# Patient Record
Sex: Male | Born: 1960
Health system: Southern US, Community
[De-identification: ages and names within clinical notes are randomized; demographics above are authoritative.]

## PROBLEM LIST (undated history)

## (undated) DIAGNOSIS — I10 Essential (primary) hypertension: Secondary | ICD-10-CM

## (undated) DIAGNOSIS — I2699 Other pulmonary embolism without acute cor pulmonale: Secondary | ICD-10-CM

## (undated) DIAGNOSIS — K219 Gastro-esophageal reflux disease without esophagitis: Secondary | ICD-10-CM

## (undated) HISTORY — PX: OTHER SURGICAL HISTORY: SHX169

## (undated) HISTORY — DX: Other pulmonary embolism without acute cor pulmonale: I26.99

## (undated) HISTORY — DX: Essential (primary) hypertension: I10

## (undated) HISTORY — PX: ROTATOR CUFF REPAIR: SHX139

## (undated) HISTORY — DX: Gastro-esophageal reflux disease without esophagitis: K21.9

---

## 1999-02-18 ENCOUNTER — Emergency Department (HOSPITAL_COMMUNITY): Admission: EM | Admit: 1999-02-18 | Discharge: 1999-02-18 | Payer: Self-pay | Admitting: Emergency Medicine

## 1999-02-18 ENCOUNTER — Encounter: Payer: Self-pay | Admitting: Emergency Medicine

## 2007-04-11 ENCOUNTER — Emergency Department (HOSPITAL_COMMUNITY): Admission: EM | Admit: 2007-04-11 | Discharge: 2007-04-11 | Payer: Self-pay | Admitting: Emergency Medicine

## 2010-06-17 ENCOUNTER — Encounter: Payer: Self-pay | Admitting: Family Medicine

## 2010-12-02 ENCOUNTER — Emergency Department (HOSPITAL_COMMUNITY): Payer: BC Managed Care – PPO

## 2010-12-02 ENCOUNTER — Emergency Department (HOSPITAL_COMMUNITY)
Admission: EM | Admit: 2010-12-02 | Discharge: 2010-12-02 | Disposition: A | Discharge status: Home / Self Care | Payer: BC Managed Care – PPO | Attending: Emergency Medicine | Admitting: Emergency Medicine

## 2010-12-02 DIAGNOSIS — R1031 Right lower quadrant pain: Secondary | ICD-10-CM | POA: Insufficient documentation

## 2010-12-02 DIAGNOSIS — K7689 Other specified diseases of liver: Secondary | ICD-10-CM | POA: Insufficient documentation

## 2010-12-02 DIAGNOSIS — R10819 Abdominal tenderness, unspecified site: Secondary | ICD-10-CM | POA: Insufficient documentation

## 2010-12-02 LAB — DIFFERENTIAL
Basophils Absolute: 0 10*3/uL (ref 0.0–0.1)
Basophils Relative: 0 % (ref 0–1)
Eosinophils Absolute: 0 10*3/uL (ref 0.0–0.7)
Eosinophils Relative: 0 % (ref 0–5)
Lymphocytes Relative: 16 % (ref 12–46)
Lymphs Abs: 1.6 10*3/uL (ref 0.7–4.0)
Monocytes Absolute: 0.9 10*3/uL (ref 0.1–1.0)
Monocytes Relative: 9 % (ref 3–12)
Neutro Abs: 7.6 10*3/uL (ref 1.7–7.7)
Neutrophils Relative %: 74 % (ref 43–77)

## 2010-12-02 LAB — POCT I-STAT, CHEM 8
BUN: 17 mg/dL (ref 6–23)
Calcium, Ion: 1.03 mmol/L — ABNORMAL LOW (ref 1.12–1.32)
Chloride: 111 mEq/L (ref 96–112)
Creatinine, Ser: 1.1 mg/dL (ref 0.50–1.35)
Glucose, Bld: 120 mg/dL — ABNORMAL HIGH (ref 70–99)
TCO2: 19 mmol/L (ref 0–100)

## 2010-12-02 LAB — CBC
Hemoglobin: 16.6 g/dL (ref 13.0–17.0)
MCH: 34.4 pg — ABNORMAL HIGH (ref 26.0–34.0)
MCHC: 36.8 g/dL — ABNORMAL HIGH (ref 30.0–36.0)
MCV: 93.4 fL (ref 78.0–100.0)
Platelets: 199 10*3/uL (ref 150–400)
RBC: 4.83 MIL/uL (ref 4.22–5.81)

## 2010-12-02 LAB — URINALYSIS, ROUTINE W REFLEX MICROSCOPIC
Bilirubin Urine: NEGATIVE
Glucose, UA: NEGATIVE mg/dL
Ketones, ur: 40 mg/dL — AB
Protein, ur: NEGATIVE mg/dL
pH: 7.5 (ref 5.0–8.0)

## 2010-12-02 MED ORDER — IOHEXOL 300 MG/ML  SOLN
125.0000 mL | Freq: Once | INTRAMUSCULAR | Status: AC | PRN
  Administered 2010-12-02: 125 mL via INTRAVENOUS

## 2010-12-03 ENCOUNTER — Other Ambulatory Visit (HOSPITAL_COMMUNITY): Payer: Self-pay | Admitting: Family Medicine

## 2010-12-03 ENCOUNTER — Ambulatory Visit (HOSPITAL_COMMUNITY)
Admission: RE | Admit: 2010-12-03 | Discharge: 2010-12-03 | Disposition: A | Discharge status: Home / Self Care | Payer: BC Managed Care – PPO | Source: Ambulatory Visit | Attending: Family Medicine | Admitting: Family Medicine

## 2010-12-03 DIAGNOSIS — N200 Calculus of kidney: Secondary | ICD-10-CM

## 2010-12-03 DIAGNOSIS — R1031 Right lower quadrant pain: Secondary | ICD-10-CM | POA: Insufficient documentation

## 2010-12-03 DIAGNOSIS — R110 Nausea: Secondary | ICD-10-CM | POA: Insufficient documentation

## 2010-12-05 ENCOUNTER — Emergency Department (HOSPITAL_COMMUNITY): Payer: BC Managed Care – PPO

## 2010-12-05 ENCOUNTER — Emergency Department (HOSPITAL_COMMUNITY)
Admission: EM | Admit: 2010-12-05 | Discharge: 2010-12-05 | Disposition: A | Discharge status: Home / Self Care | Payer: BC Managed Care – PPO | Attending: Emergency Medicine | Admitting: Emergency Medicine

## 2010-12-05 DIAGNOSIS — R109 Unspecified abdominal pain: Secondary | ICD-10-CM | POA: Insufficient documentation

## 2010-12-05 DIAGNOSIS — R112 Nausea with vomiting, unspecified: Secondary | ICD-10-CM | POA: Insufficient documentation

## 2010-12-05 LAB — CBC
MCH: 33.3 pg (ref 26.0–34.0)
MCHC: 35 g/dL (ref 30.0–36.0)
MCV: 95.3 fL (ref 78.0–100.0)
Platelets: 194 10*3/uL (ref 150–400)
RBC: 4.5 MIL/uL (ref 4.22–5.81)
RDW: 12 % (ref 11.5–15.5)

## 2010-12-05 LAB — DIFFERENTIAL
Eosinophils Absolute: 0 10*3/uL (ref 0.0–0.7)
Eosinophils Relative: 0 % (ref 0–5)
Lymphs Abs: 1.9 10*3/uL (ref 0.7–4.0)
Monocytes Absolute: 0.8 10*3/uL (ref 0.1–1.0)
Monocytes Relative: 8 % (ref 3–12)

## 2010-12-05 LAB — COMPREHENSIVE METABOLIC PANEL
Albumin: 3.9 g/dL (ref 3.5–5.2)
Alkaline Phosphatase: 51 U/L (ref 39–117)
BUN: 10 mg/dL (ref 6–23)
Creatinine, Ser: 0.99 mg/dL (ref 0.50–1.35)
Potassium: 3.4 mEq/L — ABNORMAL LOW (ref 3.5–5.1)
Total Protein: 7.6 g/dL (ref 6.0–8.3)

## 2010-12-05 LAB — URINALYSIS, ROUTINE W REFLEX MICROSCOPIC
Glucose, UA: NEGATIVE mg/dL
Hgb urine dipstick: NEGATIVE
Ketones, ur: NEGATIVE mg/dL
Leukocytes, UA: NEGATIVE
pH: 6 (ref 5.0–8.0)

## 2010-12-05 LAB — LACTIC ACID, PLASMA: Lactic Acid, Venous: 0.8 mmol/L (ref 0.5–2.2)

## 2010-12-05 LAB — URINE MICROSCOPIC-ADD ON

## 2010-12-05 LAB — LIPASE, BLOOD: Lipase: 17 U/L (ref 11–59)

## 2010-12-06 LAB — URINE CULTURE: Culture  Setup Time: 201207111319

## 2011-04-22 ENCOUNTER — Emergency Department (HOSPITAL_BASED_OUTPATIENT_CLINIC_OR_DEPARTMENT_OTHER)
Admission: EM | Admit: 2011-04-22 | Discharge: 2011-04-22 | Disposition: A | Discharge status: Home / Self Care | Payer: BC Managed Care – PPO | Attending: Emergency Medicine | Admitting: Emergency Medicine

## 2011-04-22 ENCOUNTER — Emergency Department (INDEPENDENT_AMBULATORY_CARE_PROVIDER_SITE_OTHER): Payer: BC Managed Care – PPO

## 2011-04-22 ENCOUNTER — Encounter: Payer: Self-pay | Admitting: *Deleted

## 2011-04-22 DIAGNOSIS — R109 Unspecified abdominal pain: Secondary | ICD-10-CM | POA: Insufficient documentation

## 2011-04-22 DIAGNOSIS — R1110 Vomiting, unspecified: Secondary | ICD-10-CM

## 2011-04-22 DIAGNOSIS — K5900 Constipation, unspecified: Secondary | ICD-10-CM

## 2011-04-22 LAB — DIFFERENTIAL
Basophils Absolute: 0 10*3/uL (ref 0.0–0.1)
Basophils Relative: 0 % (ref 0–1)
Eosinophils Relative: 0 % (ref 0–5)
Monocytes Absolute: 1.2 10*3/uL — ABNORMAL HIGH (ref 0.1–1.0)

## 2011-04-22 LAB — LIPASE, BLOOD: Lipase: 44 U/L (ref 11–59)

## 2011-04-22 LAB — URINALYSIS, ROUTINE W REFLEX MICROSCOPIC
Glucose, UA: NEGATIVE mg/dL
Leukocytes, UA: NEGATIVE
Nitrite: NEGATIVE
Protein, ur: NEGATIVE mg/dL

## 2011-04-22 LAB — COMPREHENSIVE METABOLIC PANEL
AST: 26 U/L (ref 0–37)
Albumin: 4.3 g/dL (ref 3.5–5.2)
CO2: 26 mEq/L (ref 19–32)
Calcium: 9.4 mg/dL (ref 8.4–10.5)
Creatinine, Ser: 1 mg/dL (ref 0.50–1.35)
GFR calc non Af Amer: 85 mL/min — ABNORMAL LOW (ref >90)

## 2011-04-22 LAB — CBC
HCT: 44.7 % (ref 39.0–52.0)
MCH: 33.1 pg (ref 26.0–34.0)
MCHC: 35.8 g/dL (ref 30.0–36.0)
MCV: 92.5 fL (ref 78.0–100.0)
RDW: 11.6 % (ref 11.5–15.5)

## 2011-04-22 MED ORDER — ONDANSETRON 8 MG PO TBDP: 8.0000 mg | ORAL_TABLET | Freq: Three times a day (TID) | ORAL | Status: AC | PRN

## 2011-04-22 MED ORDER — ONDANSETRON HCL 4 MG/2ML IJ SOLN
4.0000 mg | Freq: Once | INTRAMUSCULAR | Status: AC
  Administered 2011-04-22: 4 mg via INTRAVENOUS
  Filled 2011-04-22: qty 2

## 2011-04-22 MED ORDER — ZOLPIDEM TARTRATE 5 MG PO TABS: 5.0000 mg | ORAL_TABLET | Freq: Every evening | ORAL | Status: DC | PRN

## 2011-04-22 MED ORDER — SODIUM CHLORIDE 0.9 % IV BOLUS (SEPSIS)
1000.0000 mL | Freq: Once | INTRAVENOUS | Status: AC
  Administered 2011-04-22: 1000 mL via INTRAVENOUS

## 2011-04-22 MED ORDER — IOHEXOL 300 MG/ML  SOLN
100.0000 mL | Freq: Once | INTRAMUSCULAR | Status: AC | PRN
  Administered 2011-04-22: 100 mL via INTRAVENOUS

## 2011-04-22 NOTE — ED Notes (Signed)
Constipation. Had an xray earlier today that showed constipation.

## 2011-04-22 NOTE — ED Notes (Signed)
Pt. Drove himself here.

## 2011-04-22 NOTE — ED Provider Notes (Signed)
History     CSN: [id number] Arrival date & time: 04/22/2011  7:45 PM   First MD Initiated Contact with Patient 04/22/11 1946      Chief Complaint  Patient presents with  . Abdominal Pain    (Consider location/radiation/quality/duration/timing/severity/associated sxs/prior treatment) HPI  Patient with abdominal pain for a week.  Pain is epigasrtic in nature and is throbbing.  Off and on sharp.  Began with vomiting on Tuesday 2 times then a few times on Wednesday. Low grade temp of 99.5 and felt like he was sick with stomach virus.  Was not vomiting on Thursday and only taking po fluids.  Felt somewhat better on Saturday and Sunday- took some smoothies on Saturday, then tried slider and tater tots on Saturday and vomited with pain.  Pain mostly resolved.  Sunday felt normal and raked yard and had smoothie and chicken and rice soup.  Patient seen at [facility] and had plain films and told he was constipated and to come to ed.    History reviewed. No pertinent past medical history.  Past Surgical History  Procedure Date  . Rotator cuff surgery     No family history on file.  History  Substance Use Topics  . Smoking status: Not on file  . Smokeless tobacco: Not on file  . Alcohol Use:       Review of Systems  All other systems reviewed and are negative.    Allergies  Review of patient's allergies indicates no known allergies.  Home Medications  No current outpatient prescriptions on file.  BP 131/89  Pulse 107  Temp(Src) 98.6 F (37 C) (Oral)  Resp 20  SpO2 98%  Physical Exam  Nursing note and vitals reviewed. Constitutional: He is oriented to person, place, and time. He appears well-developed and well-nourished.  HENT:  Head: Normocephalic and atraumatic.  Eyes: Conjunctivae and EOM are normal. Pupils are equal, round, and reactive to light.  Neck: Normal range of motion. Neck supple.  Abdominal: Soft. Bowel sounds are normal. There is tenderness.         Tender bilateral lower quadrants, right greater than left.  Musculoskeletal: Normal range of motion.  Neurological: He is alert and oriented to person, place, and time. He has normal reflexes.  Skin: Skin is warm and dry.  Psychiatric: He has a normal mood and affect.    ED Course  Procedures (including critical care time)  Labs Reviewed - No data to display No results found.   No diagnosis found.    MDM  Patient with normal labs and CT with any acute findings. He is thirsty here and is drinking ginger ale. He is advised to followup with his primary care Dr. He has received IV fluids and his heart rate has decreased. He has likely had a gastroenteritis and seems to be improving without any acute intra-abdominal abnormality.        Hilario Quarry, MD 04/22/11 2218

## 2012-01-29 ENCOUNTER — Encounter (HOSPITAL_COMMUNITY): Payer: Self-pay | Admitting: *Deleted

## 2012-01-29 ENCOUNTER — Emergency Department (HOSPITAL_COMMUNITY)
Admission: EM | Admit: 2012-01-29 | Discharge: 2012-01-29 | Disposition: A | Discharge status: Home / Self Care | Payer: Worker's Compensation | Attending: Emergency Medicine | Admitting: Emergency Medicine

## 2012-01-29 DIAGNOSIS — X038XXA Other exposure to controlled fire, not in building or structure, initial encounter: Secondary | ICD-10-CM | POA: Insufficient documentation

## 2012-01-29 DIAGNOSIS — T2020XA Burn of second degree of head, face, and neck, unspecified site, initial encounter: Secondary | ICD-10-CM | POA: Insufficient documentation

## 2012-01-29 DIAGNOSIS — T2000XA Burn of unspecified degree of head, face, and neck, unspecified site, initial encounter: Secondary | ICD-10-CM

## 2012-01-29 MED ORDER — HYDROCODONE-ACETAMINOPHEN 5-325 MG PO TABS: 1.0000 | ORAL_TABLET | ORAL | Status: AC | PRN

## 2012-01-29 MED ORDER — IBUPROFEN 600 MG PO TABS: 600.0000 mg | ORAL_TABLET | Freq: Three times a day (TID) | ORAL | Status: AC | PRN

## 2012-01-29 MED ORDER — BACITRACIN ZINC 500 UNIT/GM EX OINT
TOPICAL_OINTMENT | CUTANEOUS | Status: AC
  Filled 2012-01-29: qty 3.6
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
## 2015-08-28 DIAGNOSIS — L723 Sebaceous cyst: Secondary | ICD-10-CM | POA: Diagnosis not present

## 2015-10-05 DIAGNOSIS — M6740 Ganglion, unspecified site: Secondary | ICD-10-CM | POA: Diagnosis not present

## 2015-10-05 DIAGNOSIS — L821 Other seborrheic keratosis: Secondary | ICD-10-CM | POA: Diagnosis not present

## 2015-10-05 DIAGNOSIS — L720 Epidermal cyst: Secondary | ICD-10-CM | POA: Diagnosis not present

## 2016-01-02 DIAGNOSIS — Z0000 Encounter for general adult medical examination without abnormal findings: Secondary | ICD-10-CM | POA: Diagnosis not present

## 2016-01-02 DIAGNOSIS — Z13220 Encounter for screening for lipoid disorders: Secondary | ICD-10-CM | POA: Diagnosis not present

## 2016-01-02 DIAGNOSIS — Z23 Encounter for immunization: Secondary | ICD-10-CM | POA: Diagnosis not present

## 2016-03-18 DIAGNOSIS — I10 Essential (primary) hypertension: Secondary | ICD-10-CM | POA: Diagnosis not present

## 2016-06-03 DIAGNOSIS — H524 Presbyopia: Secondary | ICD-10-CM | POA: Diagnosis not present

## 2017-01-02 DIAGNOSIS — I10 Essential (primary) hypertension: Secondary | ICD-10-CM | POA: Diagnosis not present

## 2017-01-02 DIAGNOSIS — Z125 Encounter for screening for malignant neoplasm of prostate: Secondary | ICD-10-CM | POA: Diagnosis not present

## 2017-01-02 DIAGNOSIS — Z0000 Encounter for general adult medical examination without abnormal findings: Secondary | ICD-10-CM | POA: Diagnosis not present

## 2017-03-04 DIAGNOSIS — Z23 Encounter for immunization: Secondary | ICD-10-CM | POA: Diagnosis not present

## 2017-03-04 DIAGNOSIS — I10 Essential (primary) hypertension: Secondary | ICD-10-CM | POA: Diagnosis not present

## 2017-04-04 DIAGNOSIS — M720 Palmar fascial fibromatosis [Dupuytren]: Secondary | ICD-10-CM | POA: Insufficient documentation

## 2017-04-04 DIAGNOSIS — M79642 Pain in left hand: Secondary | ICD-10-CM | POA: Diagnosis not present

## 2017-05-08 DIAGNOSIS — A084 Viral intestinal infection, unspecified: Secondary | ICD-10-CM | POA: Diagnosis not present

## 2017-05-12 DIAGNOSIS — K529 Noninfective gastroenteritis and colitis, unspecified: Secondary | ICD-10-CM | POA: Diagnosis not present

## 2017-08-20 DIAGNOSIS — M545 Low back pain: Secondary | ICD-10-CM | POA: Diagnosis not present

## 2017-10-22 DIAGNOSIS — I10 Essential (primary) hypertension: Secondary | ICD-10-CM | POA: Diagnosis not present

## 2018-01-19 ENCOUNTER — Other Ambulatory Visit: Payer: Self-pay | Admitting: Family Medicine

## 2018-01-19 ENCOUNTER — Ambulatory Visit
Admission: RE | Admit: 2018-01-19 | Discharge: 2018-01-19 | Disposition: A | Discharge status: Home / Self Care | Payer: BLUE CROSS/BLUE SHIELD | Source: Ambulatory Visit | Attending: Family Medicine | Admitting: Family Medicine

## 2018-01-19 DIAGNOSIS — J181 Lobar pneumonia, unspecified organism: Principal | ICD-10-CM

## 2018-01-19 DIAGNOSIS — J189 Pneumonia, unspecified organism: Secondary | ICD-10-CM

## 2018-01-19 DIAGNOSIS — R05 Cough: Secondary | ICD-10-CM | POA: Diagnosis not present

## 2018-01-19 DIAGNOSIS — R6883 Chills (without fever): Secondary | ICD-10-CM | POA: Diagnosis not present

## 2018-03-10 DIAGNOSIS — M7582 Other shoulder lesions, left shoulder: Secondary | ICD-10-CM | POA: Diagnosis not present

## 2018-04-07 DIAGNOSIS — M7582 Other shoulder lesions, left shoulder: Secondary | ICD-10-CM | POA: Diagnosis not present

## 2018-04-13 DIAGNOSIS — Z0000 Encounter for general adult medical examination without abnormal findings: Secondary | ICD-10-CM | POA: Diagnosis not present

## 2018-04-13 DIAGNOSIS — I10 Essential (primary) hypertension: Secondary | ICD-10-CM | POA: Diagnosis not present

## 2018-04-13 DIAGNOSIS — Z23 Encounter for immunization: Secondary | ICD-10-CM | POA: Diagnosis not present

## 2018-05-08 DIAGNOSIS — M7582 Other shoulder lesions, left shoulder: Secondary | ICD-10-CM | POA: Diagnosis not present

## 2018-05-13 DIAGNOSIS — I10 Essential (primary) hypertension: Secondary | ICD-10-CM | POA: Diagnosis not present

## 2018-05-15 DIAGNOSIS — Z23 Encounter for immunization: Secondary | ICD-10-CM | POA: Diagnosis not present

## 2018-06-02 DIAGNOSIS — R790 Abnormal level of blood mineral: Secondary | ICD-10-CM | POA: Diagnosis not present

## 2018-07-04 DIAGNOSIS — M25512 Pain in left shoulder: Secondary | ICD-10-CM | POA: Diagnosis not present

## 2018-07-06 DIAGNOSIS — M7582 Other shoulder lesions, left shoulder: Secondary | ICD-10-CM | POA: Diagnosis not present

## 2018-07-10 DIAGNOSIS — M25612 Stiffness of left shoulder, not elsewhere classified: Secondary | ICD-10-CM | POA: Diagnosis not present

## 2018-07-10 DIAGNOSIS — M7542 Impingement syndrome of left shoulder: Secondary | ICD-10-CM | POA: Diagnosis not present

## 2018-07-10 DIAGNOSIS — S46012D Strain of muscle(s) and tendon(s) of the rotator cuff of left shoulder, subsequent encounter: Secondary | ICD-10-CM | POA: Diagnosis not present

## 2018-07-10 DIAGNOSIS — M6281 Muscle weakness (generalized): Secondary | ICD-10-CM | POA: Diagnosis not present

## 2018-07-14 DIAGNOSIS — M6281 Muscle weakness (generalized): Secondary | ICD-10-CM | POA: Diagnosis not present

## 2018-07-14 DIAGNOSIS — M7542 Impingement syndrome of left shoulder: Secondary | ICD-10-CM | POA: Diagnosis not present

## 2018-07-14 DIAGNOSIS — M25512 Pain in left shoulder: Secondary | ICD-10-CM | POA: Diagnosis not present

## 2018-07-14 DIAGNOSIS — M25612 Stiffness of left shoulder, not elsewhere classified: Secondary | ICD-10-CM | POA: Diagnosis not present

## 2018-07-17 DIAGNOSIS — Z23 Encounter for immunization: Secondary | ICD-10-CM | POA: Diagnosis not present

## 2018-07-21 DIAGNOSIS — S46112D Strain of muscle, fascia and tendon of long head of biceps, left arm, subsequent encounter: Secondary | ICD-10-CM | POA: Diagnosis not present

## 2018-07-21 DIAGNOSIS — M7542 Impingement syndrome of left shoulder: Secondary | ICD-10-CM | POA: Diagnosis not present

## 2018-07-21 DIAGNOSIS — M25612 Stiffness of left shoulder, not elsewhere classified: Secondary | ICD-10-CM | POA: Diagnosis not present

## 2018-07-21 DIAGNOSIS — S46012D Strain of muscle(s) and tendon(s) of the rotator cuff of left shoulder, subsequent encounter: Secondary | ICD-10-CM | POA: Diagnosis not present

## 2018-07-24 DIAGNOSIS — S46112D Strain of muscle, fascia and tendon of long head of biceps, left arm, subsequent encounter: Secondary | ICD-10-CM | POA: Diagnosis not present

## 2018-07-24 DIAGNOSIS — M25612 Stiffness of left shoulder, not elsewhere classified: Secondary | ICD-10-CM | POA: Diagnosis not present

## 2018-07-24 DIAGNOSIS — S46012D Strain of muscle(s) and tendon(s) of the rotator cuff of left shoulder, subsequent encounter: Secondary | ICD-10-CM | POA: Diagnosis not present

## 2018-07-24 DIAGNOSIS — M7542 Impingement syndrome of left shoulder: Secondary | ICD-10-CM | POA: Diagnosis not present

## 2018-07-30 DIAGNOSIS — M7542 Impingement syndrome of left shoulder: Secondary | ICD-10-CM | POA: Diagnosis not present

## 2018-07-30 DIAGNOSIS — M25612 Stiffness of left shoulder, not elsewhere classified: Secondary | ICD-10-CM | POA: Diagnosis not present

## 2018-07-30 DIAGNOSIS — S46112D Strain of muscle, fascia and tendon of long head of biceps, left arm, subsequent encounter: Secondary | ICD-10-CM | POA: Diagnosis not present

## 2018-07-30 DIAGNOSIS — S46012D Strain of muscle(s) and tendon(s) of the rotator cuff of left shoulder, subsequent encounter: Secondary | ICD-10-CM | POA: Diagnosis not present

## 2018-08-11 DIAGNOSIS — M6281 Muscle weakness (generalized): Secondary | ICD-10-CM | POA: Diagnosis not present

## 2018-08-11 DIAGNOSIS — M25612 Stiffness of left shoulder, not elsewhere classified: Secondary | ICD-10-CM | POA: Diagnosis not present

## 2018-08-11 DIAGNOSIS — M7542 Impingement syndrome of left shoulder: Secondary | ICD-10-CM | POA: Diagnosis not present

## 2018-08-11 DIAGNOSIS — M25512 Pain in left shoulder: Secondary | ICD-10-CM | POA: Diagnosis not present

## 2018-08-14 DIAGNOSIS — M25612 Stiffness of left shoulder, not elsewhere classified: Secondary | ICD-10-CM | POA: Diagnosis not present

## 2018-08-14 DIAGNOSIS — M7542 Impingement syndrome of left shoulder: Secondary | ICD-10-CM | POA: Diagnosis not present

## 2018-08-14 DIAGNOSIS — S46012D Strain of muscle(s) and tendon(s) of the rotator cuff of left shoulder, subsequent encounter: Secondary | ICD-10-CM | POA: Diagnosis not present

## 2018-08-14 DIAGNOSIS — M6281 Muscle weakness (generalized): Secondary | ICD-10-CM | POA: Diagnosis not present

## 2018-08-24 DIAGNOSIS — M25612 Stiffness of left shoulder, not elsewhere classified: Secondary | ICD-10-CM | POA: Diagnosis not present

## 2018-09-17 DIAGNOSIS — R52 Pain, unspecified: Secondary | ICD-10-CM | POA: Diagnosis not present

## 2018-09-22 DIAGNOSIS — S46012D Strain of muscle(s) and tendon(s) of the rotator cuff of left shoulder, subsequent encounter: Secondary | ICD-10-CM | POA: Diagnosis not present

## 2018-10-12 DIAGNOSIS — I10 Essential (primary) hypertension: Secondary | ICD-10-CM | POA: Diagnosis not present

## 2018-10-14 DIAGNOSIS — M24112 Other articular cartilage disorders, left shoulder: Secondary | ICD-10-CM | POA: Diagnosis not present

## 2018-10-14 DIAGNOSIS — M19012 Primary osteoarthritis, left shoulder: Secondary | ICD-10-CM | POA: Diagnosis not present

## 2018-10-14 DIAGNOSIS — G8918 Other acute postprocedural pain: Secondary | ICD-10-CM | POA: Diagnosis not present

## 2018-10-14 DIAGNOSIS — M7522 Bicipital tendinitis, left shoulder: Secondary | ICD-10-CM | POA: Diagnosis not present

## 2018-10-14 DIAGNOSIS — S46012A Strain of muscle(s) and tendon(s) of the rotator cuff of left shoulder, initial encounter: Secondary | ICD-10-CM | POA: Diagnosis not present

## 2018-10-14 DIAGNOSIS — S43432A Superior glenoid labrum lesion of left shoulder, initial encounter: Secondary | ICD-10-CM | POA: Diagnosis not present

## 2018-10-14 DIAGNOSIS — S46112A Strain of muscle, fascia and tendon of long head of biceps, left arm, initial encounter: Secondary | ICD-10-CM | POA: Diagnosis not present

## 2018-10-14 DIAGNOSIS — M7542 Impingement syndrome of left shoulder: Secondary | ICD-10-CM | POA: Diagnosis not present

## 2018-10-21 DIAGNOSIS — S46112D Strain of muscle, fascia and tendon of long head of biceps, left arm, subsequent encounter: Secondary | ICD-10-CM | POA: Diagnosis not present

## 2018-10-21 DIAGNOSIS — S46012D Strain of muscle(s) and tendon(s) of the rotator cuff of left shoulder, subsequent encounter: Secondary | ICD-10-CM | POA: Diagnosis not present

## 2018-10-21 DIAGNOSIS — M6281 Muscle weakness (generalized): Secondary | ICD-10-CM | POA: Diagnosis not present

## 2018-10-21 DIAGNOSIS — M25612 Stiffness of left shoulder, not elsewhere classified: Secondary | ICD-10-CM | POA: Diagnosis not present

## 2018-10-22 ENCOUNTER — Emergency Department (HOSPITAL_COMMUNITY): Payer: BLUE CROSS/BLUE SHIELD

## 2018-10-22 ENCOUNTER — Emergency Department (HOSPITAL_COMMUNITY)
Admission: EM | Admit: 2018-10-22 | Discharge: 2018-10-22 | Disposition: A | Discharge status: Home / Self Care | Payer: BLUE CROSS/BLUE SHIELD | Attending: Emergency Medicine | Admitting: Emergency Medicine

## 2018-10-22 ENCOUNTER — Other Ambulatory Visit: Payer: Self-pay | Admitting: Orthopedic Surgery

## 2018-10-22 ENCOUNTER — Ambulatory Visit
Admission: RE | Admit: 2018-10-22 | Discharge: 2018-10-22 | Disposition: A | Discharge status: Home / Self Care | Payer: BLUE CROSS/BLUE SHIELD | Source: Ambulatory Visit | Attending: Orthopedic Surgery | Admitting: Orthopedic Surgery

## 2018-10-22 ENCOUNTER — Encounter (HOSPITAL_COMMUNITY): Payer: Self-pay | Admitting: Emergency Medicine

## 2018-10-22 ENCOUNTER — Other Ambulatory Visit: Payer: Self-pay

## 2018-10-22 DIAGNOSIS — J90 Pleural effusion, not elsewhere classified: Secondary | ICD-10-CM | POA: Diagnosis not present

## 2018-10-22 DIAGNOSIS — R000 Tachycardia, unspecified: Secondary | ICD-10-CM | POA: Diagnosis not present

## 2018-10-22 DIAGNOSIS — R079 Chest pain, unspecified: Secondary | ICD-10-CM

## 2018-10-22 DIAGNOSIS — R0602 Shortness of breath: Secondary | ICD-10-CM | POA: Diagnosis not present

## 2018-10-22 DIAGNOSIS — Z20828 Contact with and (suspected) exposure to other viral communicable diseases: Secondary | ICD-10-CM | POA: Diagnosis not present

## 2018-10-22 DIAGNOSIS — Z79899 Other long term (current) drug therapy: Secondary | ICD-10-CM | POA: Insufficient documentation

## 2018-10-22 DIAGNOSIS — I2510 Atherosclerotic heart disease of native coronary artery without angina pectoris: Secondary | ICD-10-CM | POA: Diagnosis not present

## 2018-10-22 DIAGNOSIS — R350 Frequency of micturition: Secondary | ICD-10-CM | POA: Diagnosis not present

## 2018-10-22 DIAGNOSIS — I2699 Other pulmonary embolism without acute cor pulmonale: Secondary | ICD-10-CM | POA: Diagnosis not present

## 2018-10-22 DIAGNOSIS — R109 Unspecified abdominal pain: Secondary | ICD-10-CM

## 2018-10-22 DIAGNOSIS — N390 Urinary tract infection, site not specified: Secondary | ICD-10-CM | POA: Diagnosis not present

## 2018-10-22 LAB — CBC WITH DIFFERENTIAL/PLATELET
Abs Immature Granulocytes: 0.02 10*3/uL (ref 0.00–0.07)
Basophils Absolute: 0.1 10*3/uL (ref 0.0–0.1)
Basophils Relative: 1 %
Eosinophils Absolute: 0 10*3/uL (ref 0.0–0.5)
Eosinophils Relative: 0 %
HCT: 35.1 % — ABNORMAL LOW (ref 39.0–52.0)
Hemoglobin: 11.8 g/dL — ABNORMAL LOW (ref 13.0–17.0)
Immature Granulocytes: 0 %
Lymphocytes Relative: 13 %
Lymphs Abs: 1 10*3/uL (ref 0.7–4.0)
MCH: 33.5 pg (ref 26.0–34.0)
MCHC: 33.6 g/dL (ref 30.0–36.0)
MCV: 99.7 fL (ref 80.0–100.0)
Monocytes Absolute: 1.2 10*3/uL — ABNORMAL HIGH (ref 0.1–1.0)
Monocytes Relative: 15 %
Neutro Abs: 5.5 10*3/uL (ref 1.7–7.7)
Neutrophils Relative %: 71 %
Platelets: 264 10*3/uL (ref 150–400)
RBC: 3.52 MIL/uL — ABNORMAL LOW (ref 4.22–5.81)
RDW: 12.1 % (ref 11.5–15.5)
WBC: 7.8 10*3/uL (ref 4.0–10.5)
nRBC: 0 % (ref 0.0–0.2)

## 2018-10-22 LAB — BASIC METABOLIC PANEL
Anion gap: 9 (ref 5–15)
BUN: 29 mg/dL — ABNORMAL HIGH (ref 6–20)
CO2: 27 mmol/L (ref 22–32)
Calcium: 9.8 mg/dL (ref 8.9–10.3)
Chloride: 99 mmol/L (ref 98–111)
Creatinine, Ser: 1.12 mg/dL (ref 0.61–1.24)
GFR calc Af Amer: >60 mL/min (ref >60)
GFR calc non Af Amer: >60 mL/min (ref >60)
Glucose, Bld: 133 mg/dL — ABNORMAL HIGH (ref 70–99)
Potassium: 3.9 mmol/L (ref 3.5–5.1)
Sodium: 135 mmol/L (ref 135–145)

## 2018-10-22 LAB — SARS CORONAVIRUS 2 BY RT PCR (HOSPITAL ORDER, PERFORMED IN ~~LOC~~ HOSPITAL LAB): SARS Coronavirus 2: NEGATIVE

## 2018-10-22 MED ORDER — RIVAROXABAN (XARELTO) EDUCATION KIT FOR DVT/PE PATIENTS
PACK | Freq: Once | Status: AC
  Administered 2018-10-22: 18:00:00
  Filled 2018-10-22: qty 1

## 2018-10-22 MED ORDER — FENTANYL CITRATE (PF) 100 MCG/2ML IJ SOLN
100.0000 ug | Freq: Once | INTRAMUSCULAR | Status: AC
  Administered 2018-10-22: 100 ug via INTRAVENOUS
  Filled 2018-10-22: qty 2

## 2018-10-22 MED ORDER — RIVAROXABAN 15 MG PO TABS
15.0000 mg | ORAL_TABLET | Freq: Once | ORAL | Status: AC
  Administered 2018-10-22: 18:00:00 15 mg via ORAL
  Filled 2018-10-22: qty 1

## 2018-10-22 MED ORDER — ONDANSETRON HCL 4 MG/2ML IJ SOLN
4.0000 mg | Freq: Once | INTRAMUSCULAR | Status: AC
  Administered 2018-10-22: 4 mg via INTRAVENOUS
  Filled 2018-10-22: qty 2

## 2018-10-22 MED ORDER — RIVAROXABAN (XARELTO) VTE STARTER PACK (15 & 20 MG): ORAL_TABLET | ORAL | 0 refills | Status: DC

## 2018-10-22 MED ORDER — ALBUTEROL SULFATE (2.5 MG/3ML) 0.083% IN NEBU
5.0000 mg | INHALATION_SOLUTION | Freq: Once | RESPIRATORY_TRACT | Status: DC
  Filled 2018-10-22: qty 6

## 2018-10-22 MED ORDER — SODIUM CHLORIDE (PF) 0.9 % IJ SOLN
INTRAMUSCULAR | Status: AC
  Filled 2018-10-22: qty 50

## 2018-10-22 MED ORDER — HYDROCODONE-ACETAMINOPHEN 5-325 MG PO TABS: 2.0000 | ORAL_TABLET | ORAL | 0 refills | Status: DC | PRN

## 2018-10-22 MED ORDER — IOHEXOL 350 MG/ML SOLN
100.0000 mL | Freq: Once | INTRAVENOUS | Status: AC | PRN
  Administered 2018-10-22: 17:00:00 100 mL via INTRAVENOUS

## 2018-10-22 NOTE — Discharge Instructions (Signed)
Take the medications as directed.  Continue your current treatment for your shoulder rehab.  Call your PCP for a follow-up appointment in 1 or 2 weeks.  ____________________________________________________________  Information on my medicine - XARELTO (rivaroxaban)  This medication education was reviewed with me or my healthcare representative as part of my discharge preparation.    WHY WAS XARELTO PRESCRIBED FOR YOU? Xarelto was prescribed to treat blood clots that may have been found in the veins of your legs (deep vein thrombosis) or in your lungs (pulmonary embolism) and to reduce the risk of them occurring again.  What do you need to know about Xarelto? The starting dose is one 15 mg tablet taken TWICE daily with food for the FIRST 21 DAYS then on 11/13/18  the dose is changed to one 20 mg tablet taken ONCE A DAY with your evening meal.  DO NOT stop taking Xarelto without talking to the health care provider who prescribed the medication.  Refill your prescription for 20 mg tablets before you run out.  After discharge, you should have regular check-up appointments with your healthcare provider that is prescribing your Xarelto.  In the future your dose may need to be changed if your kidney function changes by a significant amount.  What do you do if you miss a dose? If you are taking Xarelto TWICE DAILY and you miss a dose, take it as soon as you remember. You may take two 15 mg tablets (total 30 mg) at the same time then resume your regularly scheduled 15 mg twice daily the next day.  If you are taking Xarelto ONCE DAILY and you miss a dose, take it as soon as you remember on the same day then continue your regularly scheduled once daily regimen the next day. Do not take two doses of Xarelto at the same time.   Important Safety Information Xarelto is a blood thinner medicine that can cause bleeding. You should call your healthcare provider right away if you experience any of the  following: ? Bleeding from an injury or your nose that does not stop. ? Unusual colored urine (red or dark brown) or unusual colored stools (red or black). ? Unusual bruising for unknown reasons. ? A serious fall or if you hit your head (even if there is no bleeding).  Some medicines may interact with Xarelto and might increase your risk of bleeding while on Xarelto. To help avoid this, consult your healthcare provider or pharmacist prior to using any new prescription or non-prescription medications, including herbals, vitamins, non-steroidal anti-inflammatory drugs (NSAIDs) and supplements.  This website has more information on Xarelto: VisitDestination.com.br.

## 2018-10-22 NOTE — ED Provider Notes (Addendum)
[place] DEPT Provider Note   CSN: [id number] Arrival date & time: 10/22/18  1352    History   Chief Complaint Chief Complaint  Patient presents with   Shortness of Breath    HPI Nathan Brewer is a 58 y.o. male.     HPI   He is here for evaluation of right-sided chest pain, started yesterday, worse with deep breathing and occasionally with movement.  He had shoulder surgery done, 8 days ago, and is in a shoulder immobilizer.  He began rehabilitation for the left shoulder, yesterday.  Sometimes he feels like he has to cough but is not able to bring up any fluid.  He denies fever, chills, nausea, vomiting, weakness or dizziness.  No known exposure to Covid-19.  No other known sick contacts.  No prior history of venous thromboembolism.  There are no other known modifying factors.  History reviewed. No pertinent past medical history.  There are no active problems to display for this patient.   Past Surgical History:  Procedure Laterality Date   ROTATOR CUFF REPAIR     L shoulder   rotator cuff surgery          Home Medications    Prior to Admission medications   Medication Sig Start Date End Date Taking? Authorizing Provider  cetirizine (ZYRTEC) 10 MG tablet Take 10 mg by mouth daily.    [provider]  cyclobenzaprine (FLEXERIL) 5 MG tablet Take 5 mg by mouth daily. 10/14/18   [provider]  HYDROcodone-acetaminophen (NORCO) 5-325 MG tablet Take 2 tablets by mouth every 4 (four) hours as needed. 10/22/18   Daleen Bo, MD  HYDROmorphone (DILAUDID) 2 MG tablet Take 2 mg by mouth 2 (two) times a day. 10/16/18   [provider]  lisinopril-hydrochlorothiazide (ZESTORETIC) 20-12.5 MG tablet Take 1 tablet by mouth daily. 10/10/18   [provider]  meloxicam (MOBIC) 15 MG tablet Take 15 mg by mouth daily. 09/14/18   [provider]  naproxen (NAPROSYN) 250 MG tablet Take 250 mg by mouth 2  (two) times daily with a meal. pain    [provider]  oxyCODONE (OXY IR/ROXICODONE) 5 MG immediate release tablet Take 5 mg by mouth every 4 (four) hours as needed. 10/14/18   [provider]  Rivaroxaban 15 & 20 MG TBPK Take as directed on package: Start with one 57m tablet by mouth BID with food. On Day 22, switch to one 265mtablet QD with food. 10/22/18   WeDaleen BoMD    Family History No family history on file.  Social History Social History   Tobacco Use   Smoking status: Not on file  Substance Use Topics   Alcohol use: Yes    Comment: occa   Drug use: No     Allergies   Patient has no known allergies.   Review of Systems Review of Systems  All other systems reviewed and are negative.    Physical Exam Updated Vital Signs BP 117/75    Pulse (!) 101    Temp 98.4 F (36.9 C) (Oral)    Resp (!) 22    SpO2 94%   Physical Exam Vitals signs and nursing note reviewed.  Constitutional:      Appearance: He is well-developed.  HENT:     Head: Normocephalic and atraumatic.     Right Ear: External ear normal.     Left Ear: External ear normal.  Eyes:     Conjunctiva/sclera:  Conjunctivae normal.     Pupils: Pupils are equal, round, and reactive to light.  Neck:     Musculoskeletal: Normal range of motion and neck supple.     Trachea: Phonation normal.  Cardiovascular:     Rate and Rhythm: Normal rate and regular rhythm.     Heart sounds: Normal heart sounds.  Pulmonary:     Effort: Pulmonary effort is normal. No respiratory distress.     Breath sounds: Normal breath sounds. No stridor. No rhonchi.  Abdominal:     Palpations: Abdomen is soft.     Tenderness: There is no abdominal tenderness.  Musculoskeletal:     Comments: Left arm and shoulder immobilizer.  Normal range of motion left hand and fingers.  Normal perfusion and sensation fingers.  Legs have normal appearance there is no swelling, deformity or tenderness of either leg.   Skin:    General: Skin is warm and dry.  Neurological:     Mental Status: He is alert and oriented to person, place, and time.     Cranial Nerves: No cranial nerve deficit.     Sensory: No sensory deficit.     Motor: No abnormal muscle tone.     Coordination: Coordination normal.  Psychiatric:        Behavior: Behavior normal.        Thought Content: Thought content normal.        Judgment: Judgment normal.      ED Treatments / Results  Labs (all labs ordered are listed, but only abnormal results are displayed) Labs Reviewed  CBC WITH DIFFERENTIAL/PLATELET - Abnormal; Notable for the following components:      Result Value   RBC 3.52 (*)    Hemoglobin 11.8 (*)    HCT 35.1 (*)    Monocytes Absolute 1.2 (*)    All other components within normal limits  BASIC METABOLIC PANEL - Abnormal; Notable for the following components:   Glucose, Bld 133 (*)    BUN 29 (*)    All other components within normal limits  SARS CORONAVIRUS 2 (HOSPITAL ORDER, [facility])    EKG EKG Interpretation  Date/Time:  Thursday Oct 22 2018 14:07:00 EDT Ventricular Rate:  105 PR Interval:    QRS Duration: 91 QT Interval:  322 QTC Calculation: 426 R Axis:   47 Text Interpretation:  Sinus tachycardia Since last tracing rate faster Confirmed by Daleen Bo [phone]) on 10/22/2018 5:12:17 PM   Radiology Dg Chest 2 View  Result Date: 10/22/2018 CLINICAL DATA:  Right-sided chest and flank pain for the past day with shortness of breath. Recent rotator cuff surgery a week ago. EXAM: CHEST - 2 VIEW COMPARISON:  Chest x-ray dated January 19, 2018. FINDINGS: The heart size and mediastinal contours are within normal limits. Normal pulmonary vascularity. Low lung volumes with bibasilar atelectasis. New small right pleural effusion. No pneumothorax. No acute osseous abnormality. IMPRESSION: 1. New small right pleural effusion. 2. Low lung volumes with bibasilar atelectasis.  Electronically Signed   By: Titus Dubin M.D.   On: 10/22/2018 12:14   Ct Angio Chest Pe W And/or Wo Contrast  Result Date: 10/22/2018 CLINICAL DATA:  PE suspected, high probability. Recent shoulder surgery. Chest pain. EXAM: CT ANGIOGRAPHY CHEST WITH CONTRAST TECHNIQUE: Multidetector CT imaging of the chest was performed using the standard protocol during bolus administration of intravenous contrast. Multiplanar CT image reconstructions and MIPs were obtained to evaluate the vascular anatomy. CONTRAST:  143m OMNIPAQUE IOHEXOL 350  MG/ML SOLN COMPARISON:  None. FINDINGS: Cardiovascular: Evaluation of lower lobe branches is difficult due to respiratory motion. There appears to be filling defects within the posterior right lower lobe pulmonary arteries concerning for pulmonary emboli. Heart is normal size. Aorta is normal caliber. Coronary artery calcifications in the left anterior descending coronary artery. Mediastinum/Nodes: No mediastinal, hilar, or axillary adenopathy. Lungs/Pleura: Linear atelectasis in the lower lobes bilaterally. Small right pleural effusion. Upper Abdomen: Imaging into the upper abdomen shows no acute findings. Musculoskeletal: Chest wall soft tissues are unremarkable. No acute bony abnormality. Review of the MIP images confirms the above findings. IMPRESSION: Evaluation of the lower lobe pulmonary arteries is somewhat limited by respiratory motion. There appear to be filling defects within the posterior right lower lobe pulmonary arterial branches concerning for pulmonary emboli. Linear bibasilar opacities, likely atelectasis. Small right pleural effusion. Coronary artery disease. Electronically Signed   By: Rolm Baptise M.D.   On: 10/22/2018 16:56    Procedures Procedures (including critical care time)  Medications Ordered in ED Medications  sodium chloride (PF) 0.9 % injection (has no administration in time range)  Rivaroxaban (XARELTO) tablet 15 mg (has no administration  in time range)  rivaroxaban (XARELTO) Education Kit for DVT/PE patients (has no administration in time range)  ondansetron (ZOFRAN) injection 4 mg (4 mg Intravenous Given 10/22/18 1619)  fentaNYL (SUBLIMAZE) injection 100 mcg (100 mcg Intravenous Given 10/22/18 1619)  iohexol (OMNIPAQUE) 350 MG/ML injection 100 mL (100 mLs Intravenous Contrast Given 10/22/18 1636)     Initial Impression / Assessment and Plan / ED Course  I have reviewed the triage vital signs and the nursing notes.  Pertinent labs & imaging results that were available during my care of the patient were reviewed by me and considered in my medical decision making (see chart for details).  Clinical Course as of Oct 22 1723  Thu Oct 22, 2018  1656 Normal except glucose high, BUN high  Basic metabolic panel(!) [EW]  8338 Normal except hemoglobin low  CBC with Differential(!) [EW]  1702 Right lower lobe pulmonary emboli, with indistinct.  No large vessel disease.  Images reviewed by me  CT Angio Chest PE W and/or Wo Contrast [EW]    Clinical Course User Index [EW] Daleen Bo, MD        Patient Vitals for the past 24 hrs:  BP Temp Temp src Pulse Resp SpO2  10/22/18 1600 117/75 -- -- (!) 101 (!) 22 94 %  10/22/18 1530 118/77 -- -- (!) 104 20 96 %  10/22/18 1441 117/69 -- -- (!) 103 (!) 23 96 %  10/22/18 1430 -- -- -- (!) 102 (!) 23 96 %  10/22/18 1415 -- -- -- 96 (!) 22 96 %  10/22/18 1408 123/63 98.4 F (36.9 C) Oral (!) 104 20 96 %    3:04 PM Reevaluation with update and discussion. After initial assessment and treatment, an updated evaluation reveals he is comfortable, vital signs are normal.  Findings discussed with the patient and all questions were answered. Daleen Bo   Medical Decision Making: Pulmonary Embolism related to recent surgery.  Patient is a very low risk.  No indication for hospitalization or further intervention at this time.  Patient is in agreement with this plan  I sent a note to the  patient's orthopedist regarding the diagnosis today.  CRITICAL CARE-yes Performed by: Daleen Bo  Nursing Notes Reviewed/ Care Coordinated Applicable Imaging Reviewed Interpretation of Laboratory Data incorporated into ED treatment  The patient  appears reasonably screened and/or stabilized for discharge and I doubt any other medical condition or other [facility] requiring further screening, evaluation, or treatment in the ED at this time prior to discharge.  Plan: Home Medications-continue usual medications; Home Treatments-rest, fluids; return here if the recommended treatment, does not improve the symptoms; Recommended follow up-PCP, 1 to 2 weeks for follow-up.  Contact orthopedics, to inform them of this event.   Final Clinical Impressions(s) / ED Diagnoses   Final diagnoses:  Acute pulmonary embolism without acute cor pulmonale, unspecified pulmonary embolism type [facility])    ED Discharge Orders         Ordered    Rivaroxaban 15 & 20 MG TBPK     10/22/18 1718    HYDROcodone-acetaminophen (NORCO) 5-325 MG tablet  Every 4 hours PRN     10/22/18 1725           Daleen Bo, MD 10/22/18 1725    Daleen Bo, MD 10/22/18 2010

## 2018-10-22 NOTE — ED Triage Notes (Signed)
Per pt, states he recently had left shoulder surgery-having right CP-states increased pain with burping-PCP thinks he might have fluid on his lung-had CXR done at [facility] here for CT scan

## 2018-10-23 ENCOUNTER — Ambulatory Visit (HOSPITAL_COMMUNITY)
Admission: RE | Admit: 2018-10-23 | Discharge: 2018-10-23 | Disposition: A | Discharge status: Home / Self Care | Payer: BLUE CROSS/BLUE SHIELD | Source: Ambulatory Visit | Attending: Internal Medicine | Admitting: Internal Medicine

## 2018-10-23 ENCOUNTER — Encounter (HOSPITAL_COMMUNITY): Payer: Self-pay

## 2018-10-23 ENCOUNTER — Other Ambulatory Visit (HOSPITAL_COMMUNITY): Payer: Self-pay | Admitting: Orthopedic Surgery

## 2018-10-23 DIAGNOSIS — M7989 Other specified soft tissue disorders: Secondary | ICD-10-CM | POA: Diagnosis not present

## 2018-10-23 DIAGNOSIS — M79604 Pain in right leg: Secondary | ICD-10-CM | POA: Diagnosis not present

## 2018-10-23 NOTE — Progress Notes (Unsigned)
The right lower venous has been completed and is positive for DVT.  The left lower venous has been completed and is negative for DVT.  Preliminary results can be found under CV proc through chart review.  Elliot Gault, RVT [facility]

## 2018-10-26 DIAGNOSIS — M25612 Stiffness of left shoulder, not elsewhere classified: Secondary | ICD-10-CM | POA: Diagnosis not present

## 2018-10-26 DIAGNOSIS — M6281 Muscle weakness (generalized): Secondary | ICD-10-CM | POA: Diagnosis not present

## 2018-10-26 DIAGNOSIS — S46012D Strain of muscle(s) and tendon(s) of the rotator cuff of left shoulder, subsequent encounter: Secondary | ICD-10-CM | POA: Diagnosis not present

## 2018-10-26 DIAGNOSIS — S46112D Strain of muscle, fascia and tendon of long head of biceps, left arm, subsequent encounter: Secondary | ICD-10-CM | POA: Diagnosis not present

## 2018-10-28 DIAGNOSIS — S46112D Strain of muscle, fascia and tendon of long head of biceps, left arm, subsequent encounter: Secondary | ICD-10-CM | POA: Diagnosis not present

## 2018-10-28 DIAGNOSIS — S46012D Strain of muscle(s) and tendon(s) of the rotator cuff of left shoulder, subsequent encounter: Secondary | ICD-10-CM | POA: Diagnosis not present

## 2018-10-28 DIAGNOSIS — M6281 Muscle weakness (generalized): Secondary | ICD-10-CM | POA: Diagnosis not present

## 2018-10-28 DIAGNOSIS — M25612 Stiffness of left shoulder, not elsewhere classified: Secondary | ICD-10-CM | POA: Diagnosis not present

## 2018-10-29 DIAGNOSIS — Z86711 Personal history of pulmonary embolism: Secondary | ICD-10-CM | POA: Diagnosis not present

## 2018-11-02 DIAGNOSIS — S46012D Strain of muscle(s) and tendon(s) of the rotator cuff of left shoulder, subsequent encounter: Secondary | ICD-10-CM | POA: Diagnosis not present

## 2018-11-02 DIAGNOSIS — S46112D Strain of muscle, fascia and tendon of long head of biceps, left arm, subsequent encounter: Secondary | ICD-10-CM | POA: Diagnosis not present

## 2018-11-02 DIAGNOSIS — M6281 Muscle weakness (generalized): Secondary | ICD-10-CM | POA: Diagnosis not present

## 2018-11-02 DIAGNOSIS — M25612 Stiffness of left shoulder, not elsewhere classified: Secondary | ICD-10-CM | POA: Diagnosis not present

## 2018-11-04 DIAGNOSIS — M6281 Muscle weakness (generalized): Secondary | ICD-10-CM | POA: Diagnosis not present

## 2018-11-04 DIAGNOSIS — M25612 Stiffness of left shoulder, not elsewhere classified: Secondary | ICD-10-CM | POA: Diagnosis not present

## 2018-11-04 DIAGNOSIS — S46012D Strain of muscle(s) and tendon(s) of the rotator cuff of left shoulder, subsequent encounter: Secondary | ICD-10-CM | POA: Diagnosis not present

## 2018-11-09 DIAGNOSIS — M25612 Stiffness of left shoulder, not elsewhere classified: Secondary | ICD-10-CM | POA: Diagnosis not present

## 2018-11-09 DIAGNOSIS — S46012D Strain of muscle(s) and tendon(s) of the rotator cuff of left shoulder, subsequent encounter: Secondary | ICD-10-CM | POA: Diagnosis not present

## 2018-11-09 DIAGNOSIS — M6281 Muscle weakness (generalized): Secondary | ICD-10-CM | POA: Diagnosis not present

## 2018-11-09 DIAGNOSIS — S46112D Strain of muscle, fascia and tendon of long head of biceps, left arm, subsequent encounter: Secondary | ICD-10-CM | POA: Diagnosis not present

## 2018-11-11 DIAGNOSIS — S46112D Strain of muscle, fascia and tendon of long head of biceps, left arm, subsequent encounter: Secondary | ICD-10-CM | POA: Diagnosis not present

## 2018-11-11 DIAGNOSIS — S46012D Strain of muscle(s) and tendon(s) of the rotator cuff of left shoulder, subsequent encounter: Secondary | ICD-10-CM | POA: Diagnosis not present

## 2018-11-11 DIAGNOSIS — M25612 Stiffness of left shoulder, not elsewhere classified: Secondary | ICD-10-CM | POA: Diagnosis not present

## 2018-11-11 DIAGNOSIS — M6281 Muscle weakness (generalized): Secondary | ICD-10-CM | POA: Diagnosis not present

## 2018-11-12 DIAGNOSIS — M25612 Stiffness of left shoulder, not elsewhere classified: Secondary | ICD-10-CM | POA: Diagnosis not present

## 2018-11-16 DIAGNOSIS — S46112D Strain of muscle, fascia and tendon of long head of biceps, left arm, subsequent encounter: Secondary | ICD-10-CM | POA: Diagnosis not present

## 2018-11-16 DIAGNOSIS — M25612 Stiffness of left shoulder, not elsewhere classified: Secondary | ICD-10-CM | POA: Diagnosis not present

## 2018-11-16 DIAGNOSIS — M6281 Muscle weakness (generalized): Secondary | ICD-10-CM | POA: Diagnosis not present

## 2018-11-16 DIAGNOSIS — S46012D Strain of muscle(s) and tendon(s) of the rotator cuff of left shoulder, subsequent encounter: Secondary | ICD-10-CM | POA: Diagnosis not present

## 2018-11-18 DIAGNOSIS — S46012D Strain of muscle(s) and tendon(s) of the rotator cuff of left shoulder, subsequent encounter: Secondary | ICD-10-CM | POA: Diagnosis not present

## 2018-11-18 DIAGNOSIS — M25612 Stiffness of left shoulder, not elsewhere classified: Secondary | ICD-10-CM | POA: Diagnosis not present

## 2018-11-18 DIAGNOSIS — M6281 Muscle weakness (generalized): Secondary | ICD-10-CM | POA: Diagnosis not present

## 2018-11-23 DIAGNOSIS — M25612 Stiffness of left shoulder, not elsewhere classified: Secondary | ICD-10-CM | POA: Diagnosis not present

## 2018-11-23 DIAGNOSIS — M6281 Muscle weakness (generalized): Secondary | ICD-10-CM | POA: Diagnosis not present

## 2018-11-23 DIAGNOSIS — S46012D Strain of muscle(s) and tendon(s) of the rotator cuff of left shoulder, subsequent encounter: Secondary | ICD-10-CM | POA: Diagnosis not present

## 2018-11-23 DIAGNOSIS — S46112D Strain of muscle, fascia and tendon of long head of biceps, left arm, subsequent encounter: Secondary | ICD-10-CM | POA: Diagnosis not present

## 2018-11-25 DIAGNOSIS — S46112D Strain of muscle, fascia and tendon of long head of biceps, left arm, subsequent encounter: Secondary | ICD-10-CM | POA: Diagnosis not present

## 2018-11-25 DIAGNOSIS — M25612 Stiffness of left shoulder, not elsewhere classified: Secondary | ICD-10-CM | POA: Diagnosis not present

## 2018-11-25 DIAGNOSIS — S46012D Strain of muscle(s) and tendon(s) of the rotator cuff of left shoulder, subsequent encounter: Secondary | ICD-10-CM | POA: Diagnosis not present

## 2018-11-25 DIAGNOSIS — M6281 Muscle weakness (generalized): Secondary | ICD-10-CM | POA: Diagnosis not present

## 2018-11-30 DIAGNOSIS — M25612 Stiffness of left shoulder, not elsewhere classified: Secondary | ICD-10-CM | POA: Diagnosis not present

## 2018-11-30 DIAGNOSIS — S46012D Strain of muscle(s) and tendon(s) of the rotator cuff of left shoulder, subsequent encounter: Secondary | ICD-10-CM | POA: Diagnosis not present

## 2018-11-30 DIAGNOSIS — S46112D Strain of muscle, fascia and tendon of long head of biceps, left arm, subsequent encounter: Secondary | ICD-10-CM | POA: Diagnosis not present

## 2018-11-30 DIAGNOSIS — M6281 Muscle weakness (generalized): Secondary | ICD-10-CM | POA: Diagnosis not present

## 2018-12-02 DIAGNOSIS — S46012D Strain of muscle(s) and tendon(s) of the rotator cuff of left shoulder, subsequent encounter: Secondary | ICD-10-CM | POA: Diagnosis not present

## 2018-12-02 DIAGNOSIS — S46112D Strain of muscle, fascia and tendon of long head of biceps, left arm, subsequent encounter: Secondary | ICD-10-CM | POA: Diagnosis not present

## 2018-12-02 DIAGNOSIS — M6281 Muscle weakness (generalized): Secondary | ICD-10-CM | POA: Diagnosis not present

## 2018-12-02 DIAGNOSIS — M25612 Stiffness of left shoulder, not elsewhere classified: Secondary | ICD-10-CM | POA: Diagnosis not present

## 2018-12-07 DIAGNOSIS — M6281 Muscle weakness (generalized): Secondary | ICD-10-CM | POA: Diagnosis not present

## 2018-12-07 DIAGNOSIS — M25612 Stiffness of left shoulder, not elsewhere classified: Secondary | ICD-10-CM | POA: Diagnosis not present

## 2018-12-07 DIAGNOSIS — R42 Dizziness and giddiness: Secondary | ICD-10-CM | POA: Diagnosis not present

## 2018-12-07 DIAGNOSIS — S46112D Strain of muscle, fascia and tendon of long head of biceps, left arm, subsequent encounter: Secondary | ICD-10-CM | POA: Diagnosis not present

## 2018-12-07 DIAGNOSIS — R55 Syncope and collapse: Secondary | ICD-10-CM | POA: Diagnosis not present

## 2018-12-07 DIAGNOSIS — I10 Essential (primary) hypertension: Secondary | ICD-10-CM | POA: Diagnosis not present

## 2018-12-07 DIAGNOSIS — S46012D Strain of muscle(s) and tendon(s) of the rotator cuff of left shoulder, subsequent encounter: Secondary | ICD-10-CM | POA: Diagnosis not present

## 2018-12-07 DIAGNOSIS — I2699 Other pulmonary embolism without acute cor pulmonale: Secondary | ICD-10-CM | POA: Diagnosis not present

## 2018-12-07 NOTE — Progress Notes (Signed)
Cardiology Office Note   Date:  12/08/2018   ID:  Nathan Brewer, DOB 11/14/1960, MRN [id number]  PCP:  Elias Elseeade, Robert, MD  Cardiologist:   No primary care provider on file. Referring:  Elias Elseeade, Robert, MD  Chief Complaint  Patient presents with  . New Patient (Initial Visit)      History of Present Illness: Nathan Brewer is a 58 y.o. male who was referred by Elias Elseeade, Robert, MD for evaluation of syncope.  The patient has a history of recent pulmonary embolism and DVT.  He has no past cardiac history.  He had rotator cuff surgery in May.  He then presented with some chest discomfort and had a slight pleural effusion noted on chest x-ray.  He was found to have CT of the chest questionable pulmonary embolism.  He was subsequently found to have deep venous thrombosis and is currently on Xarelto.  He otherwise been doing well.  He says that he was sitting on the couch walking to get something from the refrigerator.  Interestingly he has a video camera in his house that is recording and so I can actually see the event.  He got up took several steps toward the kitchen, was in the kitchen door when he turned around and then had a frank syncopal spell.  Immediately upon hitting the floor you could hear him call out.  He did not have any significant trauma.  He really did not have any prodrome.  He did check his blood pressure immediately after sitting on the couch and his systolic was in the 70s.  He has never done this before.  He has some mild orthostatic symptoms rarely but this is not frequent.  He does not describe chest pressure, neck or arm discomfort.  He does not notice palpitations, presyncope or syncope.  He does not have any shortness of breath, PND orthopnea.  He has had no weight gain or edema.  He does report that his heart rate has been running a little high recently.  He shows me his blood pressures are typically in the 1 teens to 130s.   Past Medical History:  Diagnosis Date   . GERD (gastroesophageal reflux disease)   . HTN (hypertension)   . Pulmonary emboli [facility](HCC)     Past Surgical History:  Procedure Laterality Date  . ROTATOR CUFF REPAIR Left    x 2     Current Outpatient Medications  Medication Sig Dispense Refill  . cetirizine (ZYRTEC) 10 MG tablet Take 10 mg by mouth daily.    Marland Kitchen. lisinopril-hydrochlorothiazide (ZESTORETIC) 20-12.5 MG tablet Take 1 tablet by mouth daily.    Carlena Hurl. XARELTO 20 MG TABS tablet daily.      No current facility-administered medications for this visit.     Allergies:   Patient has no known allergies.    Social History:  The patient  reports that he has never smoked. He has never used smokeless tobacco. He reports current alcohol use. He reports that he does not use drugs.   Family History:  The patient's family history includes CAD in his mother.    ROS:  Please see the history of present illness.   Otherwise, review of systems are positive for none.   All other systems are reviewed and negative.    PHYSICAL EXAM: VS:  Pulse 90   Temp 97.9 F (36.6 C)   Ht 5\' 4"  (1.626 m)   Wt 194 lb 12.8 oz (88.4 kg)   BMI  33.44 kg/m  , BMI Body mass index is 33.44 kg/m. GENERAL:  Well appearing HEENT:  Pupils equal round and reactive, fundi not visualized, oral mucosa unremarkable NECK:  No jugular venous distention, waveform within normal limits, carotid upstroke brisk and symmetric, no bruits, no thyromegaly LYMPHATICS:  No cervical, inguinal adenopathy LUNGS:  Clear to auscultation bilaterally BACK:  No CVA tenderness CHEST:  Unremarkable HEART:  PMI not displaced or sustained,S1 and S2 within normal limits, no S3, no S4, no clicks, no rubs, no murmurs ABD:  Flat, positive bowel sounds normal in frequency in pitch, no bruits, no rebound, no guarding, no midline pulsatile mass, no hepatomegaly, no splenomegaly EXT:  2 plus pulses throughout, no edema, no cyanosis no clubbing SKIN:  No rashes no nodules NEURO:  Cranial  nerves II through XII grossly intact, motor grossly intact throughout PSYCH:  Cognitively intact, oriented to person place and time    EKG:  EKG is ordered today. The ekg ordered today demonstrates sinus tachycardia, rate 105, axis within normal limits, intervals within normal limits, no acute ST-T wave changes 12/08/2018   Recent Labs: 10/22/2018: BUN 29; Creatinine, Ser 1.12; Hemoglobin 11.8; Platelets 264; Potassium 3.9; Sodium 135    Lipid Panel No results found for: CHOL, TRIG, HDL, CHOLHDL, VLDL, LDLCALC, LDLDIRECT    Wt Readings from Last 3 Encounters:  12/08/18 194 lb 12.8 oz (88.4 kg)      Other studies Reviewed: Additional studies/ records that were reviewed today include: Labs, primary office records, ED May records. Review of the above records demonstrates:  Please see elsewhere in the note.     ASSESSMENT AND PLAN:  SYNCOPE: Patient had a syncopal episode that most likely represents a vasovagal event.  However, he did have a pulmonary embolism so I will make sure he has a structurally normal heart though I suspect this by exam.  I am going to check an echocardiogram.  We will also need an event monitor given his tachycardia that he reports and also some question from wearable but only takes heart rate the question of irregularity and atrial fibrillation.  HTN: His blood pressure is controlled although a little bit labile.  He has any lightheadedness in the future I would suggest reducing his lisinopril.   Current medicines are reviewed at length with the patient today.  The patient does not have concerns regarding medicines.  The following changes have been made:  no change  Labs/ tests ordered today include:   Orders Placed This Encounter  Procedures  . LONG TERM MONITOR (3-14 DAYS)  . ECHOCARDIOGRAM COMPLETE     Disposition:   FU with me as needed based on the results of the above.     Signed, Minus Breeding, MD  12/08/2018 4:51 PM    [facility]

## 2018-12-08 ENCOUNTER — Ambulatory Visit (INDEPENDENT_AMBULATORY_CARE_PROVIDER_SITE_OTHER): Payer: BC Managed Care – PPO | Admitting: Cardiology

## 2018-12-08 ENCOUNTER — Encounter: Payer: Self-pay | Admitting: Cardiology

## 2018-12-08 ENCOUNTER — Other Ambulatory Visit: Payer: Self-pay

## 2018-12-08 VITALS — HR 90 | Temp 97.9°F | Ht 64.0 in | Wt 194.8 lb

## 2018-12-08 DIAGNOSIS — H5203 Hypermetropia, bilateral: Secondary | ICD-10-CM | POA: Diagnosis not present

## 2018-12-08 DIAGNOSIS — I10 Essential (primary) hypertension: Secondary | ICD-10-CM

## 2018-12-08 DIAGNOSIS — H524 Presbyopia: Secondary | ICD-10-CM | POA: Diagnosis not present

## 2018-12-08 DIAGNOSIS — R55 Syncope and collapse: Secondary | ICD-10-CM

## 2018-12-08 DIAGNOSIS — H1045 Other chronic allergic conjunctivitis: Secondary | ICD-10-CM | POA: Diagnosis not present

## 2018-12-08 DIAGNOSIS — H2513 Age-related nuclear cataract, bilateral: Secondary | ICD-10-CM | POA: Diagnosis not present

## 2018-12-08 DIAGNOSIS — H52223 Regular astigmatism, bilateral: Secondary | ICD-10-CM | POA: Diagnosis not present

## 2018-12-08 DIAGNOSIS — H40033 Anatomical narrow angle, bilateral: Secondary | ICD-10-CM | POA: Diagnosis not present

## 2018-12-08 NOTE — Patient Instructions (Signed)
Medication Instructions:  Your physician recommends that you continue on your current medications as directed. Please refer to the Current Medication list given to you today.  If you need a refill on your cardiac medications before your next appointment, please call your pharmacy.   Lab work: NONE  Testing/Procedures: Your physician has requested that you have an echocardiogram. Echocardiography is a painless test that uses sound waves to create images of your heart. It provides your doctor with information about the size and shape of your heart and how well your heart's chambers and valves are working. This procedure takes approximately one hour. There are no restrictions for this procedure. [facility] AT [address]  2 WEEK ZIO PATCH   Follow-Up: AS NEEDED   Any Other Special Instructions Will Be Listed Below (If Applicable).   Echocardiogram An echocardiogram is a procedure that uses painless sound waves (ultrasound) to produce an image of the heart. Images from an echocardiogram can provide important information about:  Signs of coronary artery disease (CAD).  Aneurysm detection. An aneurysm is a weak or damaged part of an artery wall that bulges out from the normal force of blood pumping through the body.  Heart size and shape. Changes in the size or shape of the heart can be associated with certain conditions, including heart failure, aneurysm, and CAD.  Heart muscle function.  Heart valve function.  Signs of a past heart attack.  Fluid buildup around the heart.  Thickening of the heart muscle.  A tumor or infectious growth around the heart valves. Tell a health care provider about:  Any allergies you have.  All medicines you are taking, including vitamins, herbs, eye drops, creams, and over-the-counter medicines.  Any blood disorders you have.  Any surgeries you have had.  Any medical conditions you have.  Whether you are pregnant or may be  pregnant. What are the risks? Generally, this is a safe procedure. However, problems may occur, including:  Allergic reaction to dye (contrast) that may be used during the procedure. What happens before the procedure? No specific preparation is needed. You may eat and drink normally. What happens during the procedure?   An IV tube may be inserted into one of your veins.  You may receive contrast through this tube. A contrast is an injection that improves the quality of the pictures from your heart.  A gel will be applied to your chest.  A wand-like tool (transducer) will be moved over your chest. The gel will help to transmit the sound waves from the transducer.  The sound waves will harmlessly bounce off of your heart to allow the heart images to be captured in real-time motion. The images will be recorded on a computer. The procedure may vary among health care providers and hospitals. What happens after the procedure?  You may return to your normal, everyday life, including diet, activities, and medicines, unless your health care provider tells you not to do that. Summary  An echocardiogram is a procedure that uses painless sound waves (ultrasound) to produce an image of the heart.  Images from an echocardiogram can provide important information about the size and shape of your heart, heart muscle function, heart valve function, and fluid buildup around your heart.  You do not need to do anything to prepare before this procedure. You may eat and drink normally.  After the echocardiogram is completed, you may return to your normal, everyday life, unless your health care provider tells  you not to do that. This information is not intended to replace advice given to you by your health care provider. Make sure you discuss any questions you have with your health care provider. Document Released: 05/10/2000 Document Revised: 09/03/2018 Document Reviewed: 06/15/2016 Elsevier Patient  Education  2020 [employer].

## 2018-12-09 DIAGNOSIS — S46112D Strain of muscle, fascia and tendon of long head of biceps, left arm, subsequent encounter: Secondary | ICD-10-CM | POA: Diagnosis not present

## 2018-12-09 DIAGNOSIS — M6281 Muscle weakness (generalized): Secondary | ICD-10-CM | POA: Diagnosis not present

## 2018-12-09 DIAGNOSIS — M25612 Stiffness of left shoulder, not elsewhere classified: Secondary | ICD-10-CM | POA: Diagnosis not present

## 2018-12-09 DIAGNOSIS — S46012D Strain of muscle(s) and tendon(s) of the rotator cuff of left shoulder, subsequent encounter: Secondary | ICD-10-CM | POA: Diagnosis not present

## 2018-12-14 DIAGNOSIS — M6281 Muscle weakness (generalized): Secondary | ICD-10-CM | POA: Diagnosis not present

## 2018-12-14 DIAGNOSIS — M25612 Stiffness of left shoulder, not elsewhere classified: Secondary | ICD-10-CM | POA: Diagnosis not present

## 2018-12-14 DIAGNOSIS — S46112D Strain of muscle, fascia and tendon of long head of biceps, left arm, subsequent encounter: Secondary | ICD-10-CM | POA: Diagnosis not present

## 2018-12-14 DIAGNOSIS — S46012D Strain of muscle(s) and tendon(s) of the rotator cuff of left shoulder, subsequent encounter: Secondary | ICD-10-CM | POA: Diagnosis not present

## 2018-12-15 ENCOUNTER — Other Ambulatory Visit: Payer: Self-pay

## 2018-12-15 ENCOUNTER — Ambulatory Visit (HOSPITAL_COMMUNITY): Payer: BC Managed Care – PPO | Attending: Cardiology

## 2018-12-15 DIAGNOSIS — R55 Syncope and collapse: Secondary | ICD-10-CM

## 2018-12-16 DIAGNOSIS — M25612 Stiffness of left shoulder, not elsewhere classified: Secondary | ICD-10-CM | POA: Diagnosis not present

## 2018-12-16 DIAGNOSIS — S46012D Strain of muscle(s) and tendon(s) of the rotator cuff of left shoulder, subsequent encounter: Secondary | ICD-10-CM | POA: Diagnosis not present

## 2018-12-16 DIAGNOSIS — S46112D Strain of muscle, fascia and tendon of long head of biceps, left arm, subsequent encounter: Secondary | ICD-10-CM | POA: Diagnosis not present

## 2018-12-16 DIAGNOSIS — M6281 Muscle weakness (generalized): Secondary | ICD-10-CM | POA: Diagnosis not present

## 2018-12-24 ENCOUNTER — Telehealth: Payer: Self-pay | Admitting: Radiology

## 2018-12-24 NOTE — Telephone Encounter (Signed)
Enrolled patient for a 14 day Zio monitor to be mailed. Brief instructions were gone over and patient know to expect the monitor to arrive in 3-4 days.

## 2018-12-28 DIAGNOSIS — S46012D Strain of muscle(s) and tendon(s) of the rotator cuff of left shoulder, subsequent encounter: Secondary | ICD-10-CM | POA: Diagnosis not present

## 2018-12-28 DIAGNOSIS — M25612 Stiffness of left shoulder, not elsewhere classified: Secondary | ICD-10-CM | POA: Diagnosis not present

## 2018-12-28 DIAGNOSIS — S46112D Strain of muscle, fascia and tendon of long head of biceps, left arm, subsequent encounter: Secondary | ICD-10-CM | POA: Diagnosis not present

## 2018-12-28 DIAGNOSIS — M6281 Muscle weakness (generalized): Secondary | ICD-10-CM | POA: Diagnosis not present

## 2018-12-30 DIAGNOSIS — M6281 Muscle weakness (generalized): Secondary | ICD-10-CM | POA: Diagnosis not present

## 2018-12-30 DIAGNOSIS — M25612 Stiffness of left shoulder, not elsewhere classified: Secondary | ICD-10-CM | POA: Diagnosis not present

## 2018-12-30 DIAGNOSIS — S46112D Strain of muscle, fascia and tendon of long head of biceps, left arm, subsequent encounter: Secondary | ICD-10-CM | POA: Diagnosis not present

## 2018-12-30 DIAGNOSIS — S46012D Strain of muscle(s) and tendon(s) of the rotator cuff of left shoulder, subsequent encounter: Secondary | ICD-10-CM | POA: Diagnosis not present

## 2019-01-04 ENCOUNTER — Other Ambulatory Visit (INDEPENDENT_AMBULATORY_CARE_PROVIDER_SITE_OTHER): Payer: BC Managed Care – PPO

## 2019-01-04 DIAGNOSIS — S46012D Strain of muscle(s) and tendon(s) of the rotator cuff of left shoulder, subsequent encounter: Secondary | ICD-10-CM | POA: Diagnosis not present

## 2019-01-04 DIAGNOSIS — R55 Syncope and collapse: Secondary | ICD-10-CM | POA: Diagnosis not present

## 2019-01-04 DIAGNOSIS — S46112D Strain of muscle, fascia and tendon of long head of biceps, left arm, subsequent encounter: Secondary | ICD-10-CM | POA: Diagnosis not present

## 2019-01-04 DIAGNOSIS — M25612 Stiffness of left shoulder, not elsewhere classified: Secondary | ICD-10-CM | POA: Diagnosis not present

## 2019-01-04 DIAGNOSIS — M6281 Muscle weakness (generalized): Secondary | ICD-10-CM | POA: Diagnosis not present

## 2019-01-06 DIAGNOSIS — S46012D Strain of muscle(s) and tendon(s) of the rotator cuff of left shoulder, subsequent encounter: Secondary | ICD-10-CM | POA: Diagnosis not present

## 2019-01-06 DIAGNOSIS — M25612 Stiffness of left shoulder, not elsewhere classified: Secondary | ICD-10-CM | POA: Diagnosis not present

## 2019-01-06 DIAGNOSIS — S46112D Strain of muscle, fascia and tendon of long head of biceps, left arm, subsequent encounter: Secondary | ICD-10-CM | POA: Diagnosis not present

## 2019-01-06 DIAGNOSIS — M6281 Muscle weakness (generalized): Secondary | ICD-10-CM | POA: Diagnosis not present

## 2019-01-11 DIAGNOSIS — M6281 Muscle weakness (generalized): Secondary | ICD-10-CM | POA: Diagnosis not present

## 2019-01-11 DIAGNOSIS — S46112D Strain of muscle, fascia and tendon of long head of biceps, left arm, subsequent encounter: Secondary | ICD-10-CM | POA: Diagnosis not present

## 2019-01-11 DIAGNOSIS — S46012D Strain of muscle(s) and tendon(s) of the rotator cuff of left shoulder, subsequent encounter: Secondary | ICD-10-CM | POA: Diagnosis not present

## 2019-01-11 DIAGNOSIS — M25612 Stiffness of left shoulder, not elsewhere classified: Secondary | ICD-10-CM | POA: Diagnosis not present

## 2019-01-13 DIAGNOSIS — S46012D Strain of muscle(s) and tendon(s) of the rotator cuff of left shoulder, subsequent encounter: Secondary | ICD-10-CM | POA: Diagnosis not present

## 2019-01-13 DIAGNOSIS — S46112D Strain of muscle, fascia and tendon of long head of biceps, left arm, subsequent encounter: Secondary | ICD-10-CM | POA: Diagnosis not present

## 2019-01-13 DIAGNOSIS — M25612 Stiffness of left shoulder, not elsewhere classified: Secondary | ICD-10-CM | POA: Diagnosis not present

## 2019-01-13 DIAGNOSIS — M6281 Muscle weakness (generalized): Secondary | ICD-10-CM | POA: Diagnosis not present

## 2019-01-14 DIAGNOSIS — Z86711 Personal history of pulmonary embolism: Secondary | ICD-10-CM | POA: Diagnosis not present

## 2019-01-14 DIAGNOSIS — I10 Essential (primary) hypertension: Secondary | ICD-10-CM | POA: Diagnosis not present

## 2019-01-18 DIAGNOSIS — M25612 Stiffness of left shoulder, not elsewhere classified: Secondary | ICD-10-CM | POA: Diagnosis not present

## 2019-01-18 DIAGNOSIS — M6281 Muscle weakness (generalized): Secondary | ICD-10-CM | POA: Diagnosis not present

## 2019-01-18 DIAGNOSIS — S46112D Strain of muscle, fascia and tendon of long head of biceps, left arm, subsequent encounter: Secondary | ICD-10-CM | POA: Diagnosis not present

## 2019-01-18 DIAGNOSIS — S46012D Strain of muscle(s) and tendon(s) of the rotator cuff of left shoulder, subsequent encounter: Secondary | ICD-10-CM | POA: Diagnosis not present

## 2019-01-20 DIAGNOSIS — S46012D Strain of muscle(s) and tendon(s) of the rotator cuff of left shoulder, subsequent encounter: Secondary | ICD-10-CM | POA: Diagnosis not present

## 2019-01-20 DIAGNOSIS — M25612 Stiffness of left shoulder, not elsewhere classified: Secondary | ICD-10-CM | POA: Diagnosis not present

## 2019-01-20 DIAGNOSIS — S46112D Strain of muscle, fascia and tendon of long head of biceps, left arm, subsequent encounter: Secondary | ICD-10-CM | POA: Diagnosis not present

## 2019-01-20 DIAGNOSIS — M6281 Muscle weakness (generalized): Secondary | ICD-10-CM | POA: Diagnosis not present

## 2019-01-25 DIAGNOSIS — S46112D Strain of muscle, fascia and tendon of long head of biceps, left arm, subsequent encounter: Secondary | ICD-10-CM | POA: Diagnosis not present

## 2019-01-25 DIAGNOSIS — M6281 Muscle weakness (generalized): Secondary | ICD-10-CM | POA: Diagnosis not present

## 2019-01-25 DIAGNOSIS — S46012D Strain of muscle(s) and tendon(s) of the rotator cuff of left shoulder, subsequent encounter: Secondary | ICD-10-CM | POA: Diagnosis not present

## 2019-01-25 DIAGNOSIS — M25612 Stiffness of left shoulder, not elsewhere classified: Secondary | ICD-10-CM | POA: Diagnosis not present

## 2019-01-27 DIAGNOSIS — S46012D Strain of muscle(s) and tendon(s) of the rotator cuff of left shoulder, subsequent encounter: Secondary | ICD-10-CM | POA: Diagnosis not present

## 2019-01-27 DIAGNOSIS — M25612 Stiffness of left shoulder, not elsewhere classified: Secondary | ICD-10-CM | POA: Diagnosis not present

## 2019-01-27 DIAGNOSIS — M6281 Muscle weakness (generalized): Secondary | ICD-10-CM | POA: Diagnosis not present

## 2019-01-27 DIAGNOSIS — S46112D Strain of muscle, fascia and tendon of long head of biceps, left arm, subsequent encounter: Secondary | ICD-10-CM | POA: Diagnosis not present

## 2019-02-04 ENCOUNTER — Telehealth: Payer: Self-pay | Admitting: Cardiology

## 2019-02-04 NOTE — Telephone Encounter (Signed)
Result Notes for LONG TERM MONITOR (3-14 DAYS)  Notes recorded by Minus Breeding, MD on 01/22/2019 at 1:18 PM EDT  No significant arrhythmias. Brief runs of SVT. No further work up. Call Mr. Paar with the results and send results to Maury Dus, MD   Results reviewed with pt. No further questions. Results also faxed to pt PCP Maury Dus, MD via [facility] fax function

## 2019-02-04 NOTE — Telephone Encounter (Signed)
  Patient is calling regarding monitor results

## 2019-02-08 DIAGNOSIS — S46112D Strain of muscle, fascia and tendon of long head of biceps, left arm, subsequent encounter: Secondary | ICD-10-CM | POA: Diagnosis not present

## 2019-02-08 DIAGNOSIS — M25612 Stiffness of left shoulder, not elsewhere classified: Secondary | ICD-10-CM | POA: Diagnosis not present

## 2019-02-08 DIAGNOSIS — M6281 Muscle weakness (generalized): Secondary | ICD-10-CM | POA: Diagnosis not present

## 2019-02-08 DIAGNOSIS — S46012D Strain of muscle(s) and tendon(s) of the rotator cuff of left shoulder, subsequent encounter: Secondary | ICD-10-CM | POA: Diagnosis not present

## 2019-02-10 DIAGNOSIS — S46112D Strain of muscle, fascia and tendon of long head of biceps, left arm, subsequent encounter: Secondary | ICD-10-CM | POA: Diagnosis not present

## 2019-02-10 DIAGNOSIS — S46012D Strain of muscle(s) and tendon(s) of the rotator cuff of left shoulder, subsequent encounter: Secondary | ICD-10-CM | POA: Diagnosis not present

## 2019-02-10 DIAGNOSIS — M25612 Stiffness of left shoulder, not elsewhere classified: Secondary | ICD-10-CM | POA: Diagnosis not present

## 2019-02-10 DIAGNOSIS — M6281 Muscle weakness (generalized): Secondary | ICD-10-CM | POA: Diagnosis not present

## 2019-02-11 DIAGNOSIS — M25612 Stiffness of left shoulder, not elsewhere classified: Secondary | ICD-10-CM | POA: Diagnosis not present

## 2019-02-15 DIAGNOSIS — S46012D Strain of muscle(s) and tendon(s) of the rotator cuff of left shoulder, subsequent encounter: Secondary | ICD-10-CM | POA: Diagnosis not present

## 2019-02-15 DIAGNOSIS — M6281 Muscle weakness (generalized): Secondary | ICD-10-CM | POA: Diagnosis not present

## 2019-02-15 DIAGNOSIS — M25612 Stiffness of left shoulder, not elsewhere classified: Secondary | ICD-10-CM | POA: Diagnosis not present

## 2019-02-15 DIAGNOSIS — S46112D Strain of muscle, fascia and tendon of long head of biceps, left arm, subsequent encounter: Secondary | ICD-10-CM | POA: Diagnosis not present

## 2019-02-22 DIAGNOSIS — S46112D Strain of muscle, fascia and tendon of long head of biceps, left arm, subsequent encounter: Secondary | ICD-10-CM | POA: Diagnosis not present

## 2019-02-22 DIAGNOSIS — S46012D Strain of muscle(s) and tendon(s) of the rotator cuff of left shoulder, subsequent encounter: Secondary | ICD-10-CM | POA: Diagnosis not present

## 2019-02-22 DIAGNOSIS — M25612 Stiffness of left shoulder, not elsewhere classified: Secondary | ICD-10-CM | POA: Diagnosis not present

## 2019-02-22 DIAGNOSIS — M6281 Muscle weakness (generalized): Secondary | ICD-10-CM | POA: Diagnosis not present

## 2019-03-09 ENCOUNTER — Other Ambulatory Visit: Payer: Self-pay

## 2019-03-09 DIAGNOSIS — Z20828 Contact with and (suspected) exposure to other viral communicable diseases: Secondary | ICD-10-CM | POA: Diagnosis not present

## 2019-03-09 DIAGNOSIS — Z20822 Contact with and (suspected) exposure to covid-19: Secondary | ICD-10-CM

## 2019-03-10 LAB — NOVEL CORONAVIRUS, NAA: SARS-CoV-2, NAA: NOT DETECTED

## 2019-04-27 DIAGNOSIS — Z0000 Encounter for general adult medical examination without abnormal findings: Secondary | ICD-10-CM | POA: Diagnosis not present

## 2019-04-27 DIAGNOSIS — Z13220 Encounter for screening for lipoid disorders: Secondary | ICD-10-CM | POA: Diagnosis not present

## 2019-04-27 DIAGNOSIS — Z23 Encounter for immunization: Secondary | ICD-10-CM | POA: Diagnosis not present

## 2019-05-05 DIAGNOSIS — Z20828 Contact with and (suspected) exposure to other viral communicable diseases: Secondary | ICD-10-CM | POA: Diagnosis not present

## 2019-05-07 DIAGNOSIS — Z20828 Contact with and (suspected) exposure to other viral communicable diseases: Secondary | ICD-10-CM | POA: Diagnosis not present

## 2019-05-17 DIAGNOSIS — Z03818 Encounter for observation for suspected exposure to other biological agents ruled out: Secondary | ICD-10-CM | POA: Diagnosis not present

## 2019-12-11 IMAGING — CR DG CHEST 2V
2 series · 2 of 2 positions shown · non-contrast
Comparison: None.

CLINICAL DATA: Left lower lobe pneumonia.

EXAM:
CHEST - 2 VIEW

[w chest pa]
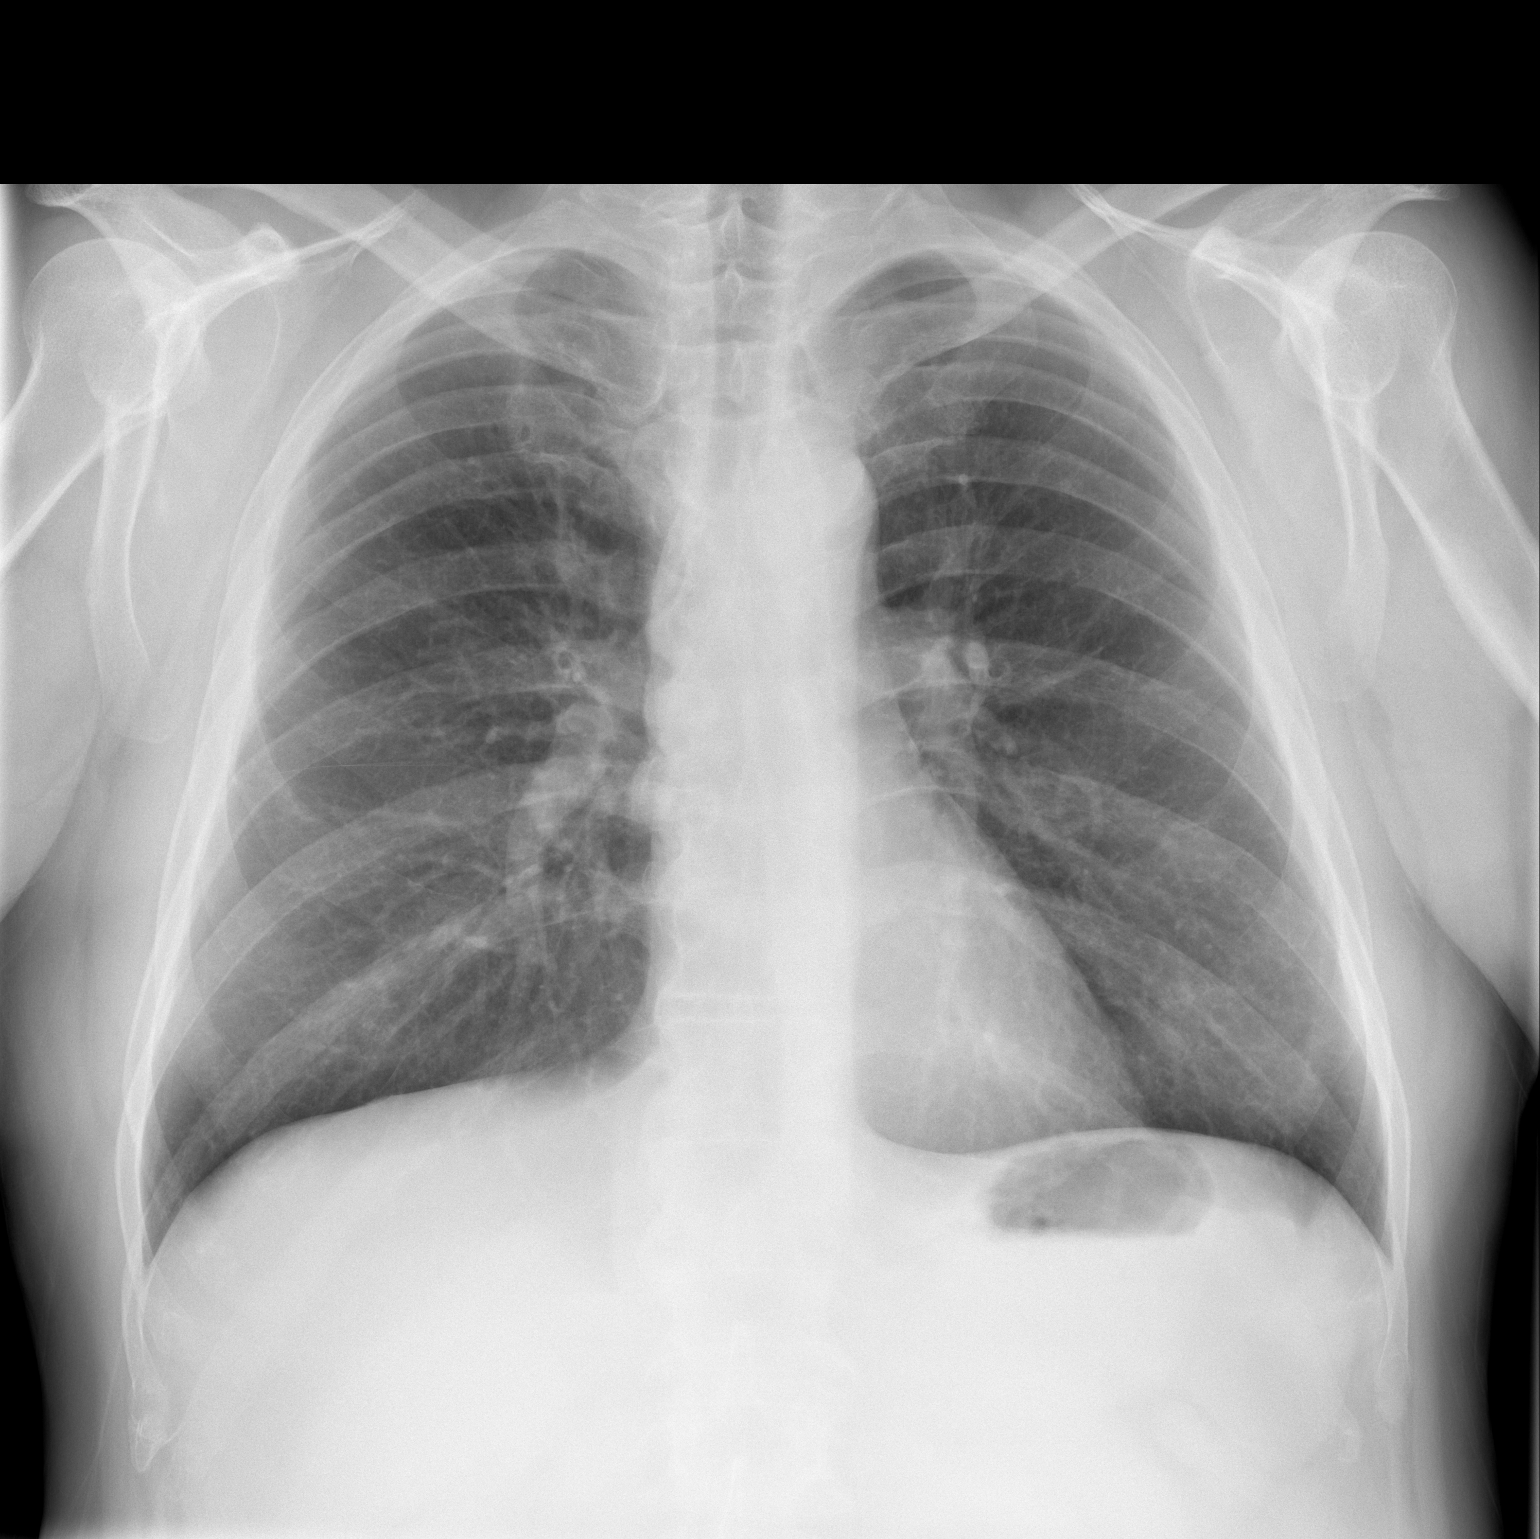

[w chest lat]
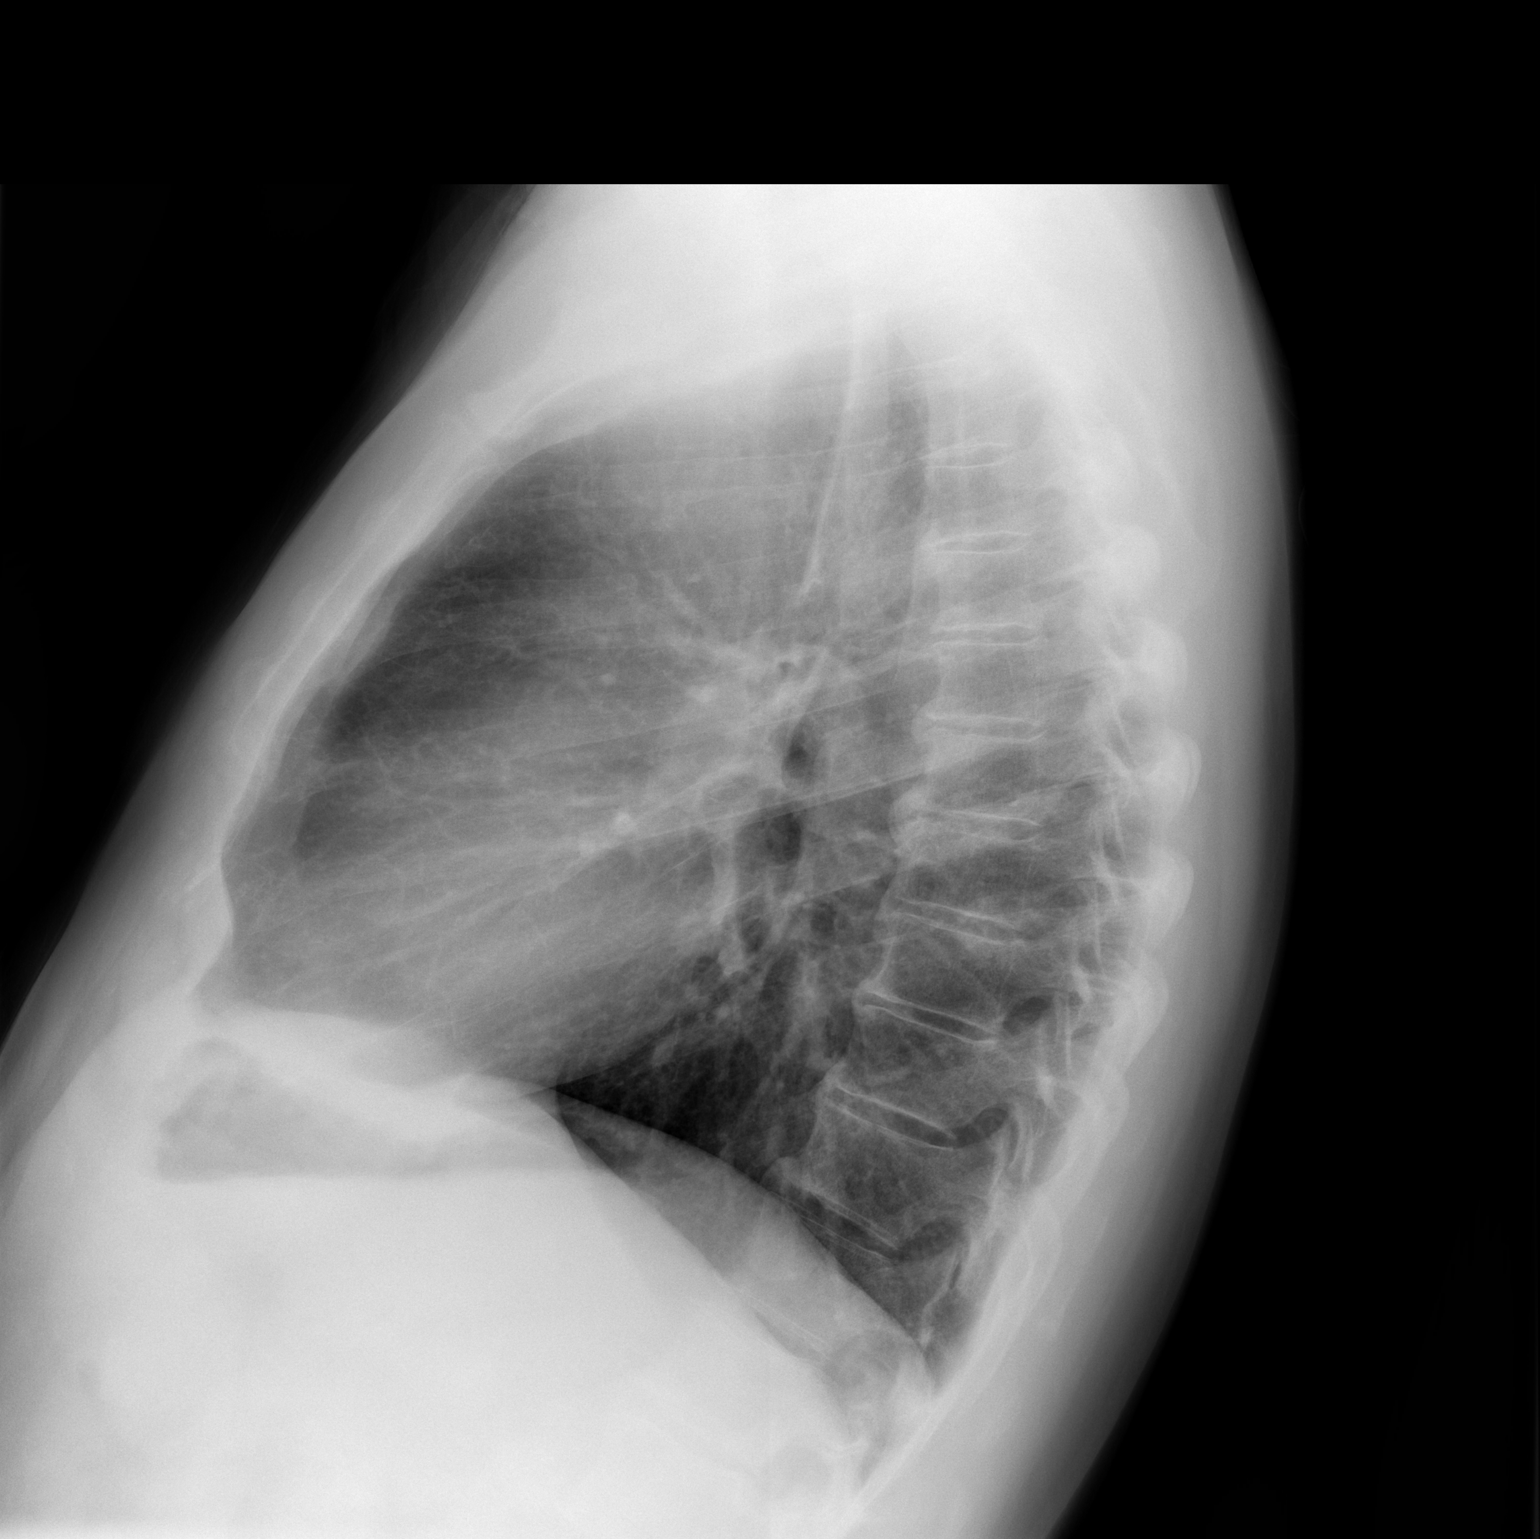

[2 of 2 positions shown; findings below may reference images not displayed]

FINDINGS: Normal heart size and mediastinal contours. No acute infiltrate or
edema. No effusion or pneumothorax. Degenerative endplate spurring
no acute osseous findings.
IMPRESSION: Negative chest.

## 2020-01-03 ENCOUNTER — Telehealth: Payer: Self-pay

## 2020-01-03 NOTE — Telephone Encounter (Signed)
Patient walked in office.Stated he decided to do a home ekg since he has not been seen in about 1 year.Stated ekg said abnormal.Stated he feels ok, no chest pain or sob.Appointment scheduled with Azalee Course PA 8/10 at 3:45 pm.Advised to bring home ekg to appointment.

## 2020-01-04 ENCOUNTER — Other Ambulatory Visit: Payer: Self-pay

## 2020-01-04 ENCOUNTER — Ambulatory Visit (INDEPENDENT_AMBULATORY_CARE_PROVIDER_SITE_OTHER): Payer: 59 | Admitting: Physician Assistant

## 2020-01-04 ENCOUNTER — Encounter: Payer: Self-pay | Admitting: Physician Assistant

## 2020-01-04 VITALS — BP 90/66 | HR 88 | Ht 69.0 in | Wt 203.0 lb

## 2020-01-04 DIAGNOSIS — R55 Syncope and collapse: Secondary | ICD-10-CM

## 2020-01-04 DIAGNOSIS — R42 Dizziness and giddiness: Secondary | ICD-10-CM | POA: Diagnosis not present

## 2020-01-04 DIAGNOSIS — I959 Hypotension, unspecified: Secondary | ICD-10-CM

## 2020-01-04 MED ORDER — LISINOPRIL 10 MG PO TABS: 10.0000 mg | ORAL_TABLET | Freq: Every day | ORAL | 3 refills | Status: DC

## 2020-01-04 NOTE — Patient Instructions (Signed)
Medication Instructions:   DISCONTINUE Lisinopril-HCTZ  START Lisinopril 10 mg on Monday 01/10/20 *If you need a refill on your cardiac medications before your next appointment, please call your pharmacy*  Lab Work: Your physician recommends that you return for lab work TODAY:   BMET  CBC If you have labs (blood work) drawn today and your tests are completely normal, you will receive your results only by: Marland Kitchen MyChart Message (if you have MyChart) OR . A paper copy in the mail If you have any lab test that is abnormal or we need to change your treatment, we will call you to review the results.  Testing/Procedures: NONE ordered at this time of appointment   Follow-Up: At [facility], you and your health needs are our priority.  As part of our continuing mission to provide you with exceptional heart care, we have created designated Provider Care Teams.  These Care Teams include your primary Cardiologist (physician) and Advanced Practice Providers (APPs -  Physician Assistants and Nurse Practitioners) who all work together to provide you with the care you need, when you need it.  Your next appointment:   1-2 week(s)  The format for your next appointment:   In Person  Provider:   Rollene Rotunda, MD or APP   Other Instructions  Upon arrival home after today's visit: NO activity, and hydrate yourself by drinking plenty of fluids  For 2 days Wednesday 01/05/20 and Thursday 01/06/20 monitor your Systolic (top number) blood pressure. If your top number is less than 100 for both days delay fishing trip.  Keep hydration going for 01/05/20 and 01/06/20

## 2020-01-04 NOTE — Progress Notes (Signed)
Cardiology Office Note:    Date:  01/06/2020   ID:  Nathan Brewer [place]ell, DOB 02/17/1961, MRN [id number]  PCP:  Elias Elseeade, Robert, MD  [facility] Cardiologist:  Rollene RotundaJames Hochrein, MD  [facility] Electrophysiologist:  None   Referring MD: Elias Elseeade, Robert, MD   Chief Complaint  Patient presents with  . Follow-up    dizziness, seen for Dr. Antoine PocheHochrein    History of Present Illness:    Nathan Brewer Sammarco is a 59 y.o. male with a hx of hypertension, GERD and DVT/PE.  Patient was initially referred to cardiology service for evaluation of syncope.  He has no prior cardiac history.  Patient initially had rotator cuff surgery in May 2020 and later presented with some chest discomfort and a small pleural effusion noted on the chest x-ray.  CT showed questionable PE and that he subsequently found to have a DVT and was placed on Xarelto.  He later was evaluated for syncopal episode that occurred in his home kitchen.  He did not have significant trauma.  Blood pressure immediately after sitting down was in the 70s systolic.  His blood pressure is typically in the 110s to 130s range.  When he was seen by Dr. Antoine PocheHochrein, it was suspected he might had vasovagal event.  A heart monitor and echocardiogram was recommended.  Echocardiogram performed on 12/15/2018 showed EF 60 to 65%, no evidence of LV wall motion abnormality, normal RVSP, mild mitral annular calcification however no significant valve disease otherwise.  Subsequent Zio patch monitor showed no significant arrhythmia, brief run of SVT lasting only 5 beats, no further work-up was recommended.  Interestingly, average heart rate on the heart monitor was actually around 100 bpm.  Covid test obtained in October 2020 was negative.  Patient presents today for cardiology office visit along with his wife.  He has been feeling dizziness and malaise recently.  Blood pressure on initial arrival was 90/66.  Repeat manual blood pressure by myself was 88/70.  He denies any  chest pain.  EKG does not show any obvious changes.  He does have a single lead EKG strip from home on his android phone.  This showed sinus rhythm without any evidence of PVCs, VT or atrial fibrillation/atrial flutter.  His heart rate is usually borderline elevated at home in the 100 range.  Today in the office, his heart rate is 88 bpm.  I suspect he is dehydrated.  He is on lisinopril-hydrochlorothiazide which he has been taking for the past 3 years.  I recommended to stop this medication for now.  I initially recommended to restart on a lower dose of lisinopril later this week, however he is going on a fishing trip this Thursday and will be back this Sunday night, therefore I will recommend restarting lisinopril 10 mg next Monday after he returns.  I will allow his blood pressure to drift up in the next few days.  He will need a CBC and basic metabolic panel.  I would not be surprised if his renal functions down given the recent hypotension.  I instructed the patient to keep himself hydrated once he get home today and also tomorrow.  If his systolic blood pressure remain lower than 100 mmHg, I would recommend delay his fishing trip.  Past Medical History:  Diagnosis Date  . GERD (gastroesophageal reflux disease)   . HTN (hypertension)   . Pulmonary emboli [facility](HCC)     Past Surgical History:  Procedure Laterality Date  . ROTATOR CUFF REPAIR Left  x 2    Current Medications: Current Meds  Medication Sig  . cetirizine (ZYRTEC) 10 MG tablet Take 10 mg by mouth daily.  . [DISCONTINUED] lisinopril-hydrochlorothiazide (ZESTORETIC) 20-12.5 MG tablet Take 1 tablet by mouth daily.     Allergies:   Patient has no known allergies.   Social History   Socioeconomic History  . Marital status: Married    Spouse name: Not on file  . Number of children: Not on file  . Years of education: Not on file  . Highest education level: Not on file  Occupational History  . Not on file  Tobacco Use  .  Smoking status: Never Smoker  . Smokeless tobacco: Never Used  Substance and Sexual Activity  . Alcohol use: Yes    Comment: occa  . Drug use: No  . Sexual activity: Not on file  Other Topics Concern  . Not on file  Social History Narrative   Lives at home with wife and 41 year old son.  Another son is in residential living and has autism.  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    Social Determinants of Health   Financial Resource Strain:   . Difficulty of Paying Living Expenses:   Food Insecurity:   . Worried About Programme researcher, broadcasting/film/video in the Last Year:   . Barista in the Last Year:   Transportation Needs:   . Freight forwarder (Medical):   Marland Kitchen Lack of Transportation (Non-Medical):   Physical Activity:   . Days of Exercise per Week:   . Minutes of Exercise per Session:   Stress:   . Feeling of Stress :   Social Connections:   . Frequency of Communication with Friends and Family:   . Frequency of Social Gatherings with Friends and Family:   . Attends Religious Services:   . Active Member of Clubs or Organizations:   . Attends Banker Meetings:   Marland Kitchen Marital Status:      Family History: The patient's family history includes CAD in his mother.  ROS:   Please see the history of present illness.     All other systems reviewed and are negative.  EKGs/Labs/Other Studies Reviewed:    The following studies were reviewed today:  Echo 12/15/2018 1. The left ventricle has normal systolic function with an ejection  fraction of 60-65%. The cavity size was normal. Left ventricular diastolic  Doppler parameters are consistent with impaired relaxation. No evidence of  left ventricular regional wall  motion abnormalities.  2. The right ventricle has normal systolic function. The cavity was  normal. There is no increase in right ventricular wall thickness.  3. Mild thickening of the mitral valve leaflet. There is mild mitral  annular calcification  present.  4. The aortic valve is tricuspid.  5. The aortic root is normal in size and structure.   EKG:  EKG is ordered today.  The ekg ordered today demonstrates normal sinus rhythm without significant ST-T wave changes.  Recent Labs: 01/04/2020: BUN 43; Creatinine, Ser 1.66; Hemoglobin 13.7; Platelets 250; Potassium 4.6; Sodium 139  Recent Lipid Panel No results found for: CHOL, TRIG, HDL, CHOLHDL, VLDL, LDLCALC, LDLDIRECT  Physical Exam:    VS:  BP 90/66 (BP Location: Left Arm, Patient Position: Sitting, Cuff Size: Normal)   Pulse 88   Ht 5\' 9"  (1.753 m)   Wt 203 lb (92.1 kg)   SpO2 97%   BMI 29.98 kg/m  Wt Readings from Last 3 Encounters:  01/04/20 203 lb (92.1 kg)  12/08/18 194 lb 12.8 oz (88.4 kg)     GEN:  Well nourished, well developed in no acute distress HEENT: Normal NECK: No JVD; No carotid bruits LYMPHATICS: No lymphadenopathy CARDIAC: RRR, no murmurs, rubs, gallops RESPIRATORY:  Clear to auscultation without rales, wheezing or rhonchi  ABDOMEN: Soft, non-tender, non-distended MUSCULOSKELETAL:  No edema; No deformity  SKIN: Warm and dry NEUROLOGIC:  Alert and oriented x 3 PSYCHIATRIC:  Normal affect   ASSESSMENT:    1. Hypotension, unspecified hypotension type   2. Dizziness   3. Vasovagal syncope    PLAN:    In order of problems listed above:  1. Hypotension: Patient has a history of hypertension and was on lisinopril-hydrochlorothiazide for years.  However blood pressure on arrival today was hypotensive.  He denies any recent chest discomfort or or significant shortness of breath.  He does have some dizziness which is likely related to hypotension.  He has an android phone that is capable of obtaining a single lead EKG.  I have reviewed his EKG strips from his phone, there was no evidence of arrhythmia recently when he was symptomatic.  I recommended discontinuing lisinopril-hydrochlorothiazide.  I will restart lisinopril at a much lower dose on  Monday.  He has a fishing trip this Thursday, I advised him to aggressively hydrate himself today and tomorrow, if systolic blood pressure is greater than 100 on Thursday, he may go for the fishing trip, otherwise if it is still low, I would recommend hold off on the trip. He will be accompanied on the trip and will not be alone. I recommended CBC and basic metabolic panel.  I would not be surprised if his renal function is down given the recent hypotension.  2. Dizziness: Likely related to #1.  I will stop lisinopril-hydrochlorothiazide and restart lisinopril in a few days at a much lower dose.  3. History of vasovagal syncope: No recurrence.   Medication Adjustments/Labs and Tests Ordered: Current medicines are reviewed at length with the patient today.  Concerns regarding medicines are outlined above.  Orders Placed This Encounter  Procedures  . CBC  . Basic metabolic panel  . EKG 12-Lead   Meds ordered this encounter  Medications  . lisinopril (ZESTRIL) 10 MG tablet    Sig: Take 1 tablet (10 mg total) by mouth daily.    Dispense:  90 tablet    Refill:  3    Patient Instructions  Medication Instructions:   DISCONTINUE Lisinopril-HCTZ  START Lisinopril 10 mg on Monday 01/10/20 *If you need a refill on your cardiac medications before your next appointment, please call your pharmacy*  Lab Work: Your physician recommends that you return for lab work TODAY:   BMET  CBC If you have labs (blood work) drawn today and your tests are completely normal, you will receive your results only by: Marland Kitchen MyChart Message (if you have MyChart) OR . A paper copy in the mail If you have any lab test that is abnormal or we need to change your treatment, we will call you to review the results.  Testing/Procedures: NONE ordered at this time of appointment   Follow-Up: At [facility], you and your health needs are our priority.  As part of our continuing mission to provide you with exceptional  heart care, we have created designated Provider Care Teams.  These Care Teams include your primary Cardiologist (physician) and Advanced Practice Providers (APPs -  Physician Assistants and Nurse Practitioners) who all work together to provide you with the care you need, when you need it.  Your next appointment:   1-2 week(s)  The format for your next appointment:   In Person  Provider:   Rollene Rotunda, MD or APP   Other Instructions  Upon arrival home after today's visit: NO activity, and hydrate yourself by drinking plenty of fluids  For 2 days Wednesday 01/05/20 and Thursday 01/06/20 monitor your Systolic (top number) blood pressure. If your top number is less than 100 for both days delay fishing trip.  Keep hydration going for 01/05/20 and 01/06/20      Signed, Azalee Course, PA  01/06/2020 1:15 PM    [facility]

## 2020-01-05 ENCOUNTER — Other Ambulatory Visit: Payer: Self-pay

## 2020-01-05 DIAGNOSIS — N179 Acute kidney failure, unspecified: Secondary | ICD-10-CM

## 2020-01-05 LAB — BASIC METABOLIC PANEL
BUN/Creatinine Ratio: 26 — ABNORMAL HIGH (ref 10–24)
BUN: 43 mg/dL — ABNORMAL HIGH (ref 8–27)
CO2: 23 mmol/L (ref 20–29)
Calcium: 10.4 mg/dL — ABNORMAL HIGH (ref 8.6–10.2)
Chloride: 99 mmol/L (ref 96–106)
Creatinine, Ser: 1.66 mg/dL — ABNORMAL HIGH (ref 0.76–1.27)
GFR calc Af Amer: 51 mL/min/{1.73_m2} — ABNORMAL LOW (ref >59)
GFR calc non Af Amer: 44 mL/min/{1.73_m2} — ABNORMAL LOW (ref >59)
Glucose: 107 mg/dL — ABNORMAL HIGH (ref 65–99)
Potassium: 4.6 mmol/L (ref 3.5–5.2)
Sodium: 139 mmol/L (ref 134–144)

## 2020-01-05 LAB — CBC
Hematocrit: 39.8 % (ref 37.5–51.0)
Hemoglobin: 13.7 g/dL (ref 13.0–17.7)
MCH: 33.8 pg — ABNORMAL HIGH (ref 26.6–33.0)
MCHC: 34.4 g/dL (ref 31.5–35.7)
MCV: 98 fL — ABNORMAL HIGH (ref 79–97)
Platelets: 250 10*3/uL (ref 150–450)
RBC: 4.05 x10E6/uL — ABNORMAL LOW (ref 4.14–5.80)
RDW: 12.6 % (ref 11.6–15.4)
WBC: 6.6 10*3/uL (ref 3.4–10.8)

## 2020-01-05 NOTE — Progress Notes (Signed)
Placed order given verbally by Azalee Course, PA-C

## 2020-01-06 ENCOUNTER — Encounter: Payer: Self-pay | Admitting: Physician Assistant

## 2020-01-10 ENCOUNTER — Other Ambulatory Visit: Payer: Self-pay

## 2020-01-10 DIAGNOSIS — N179 Acute kidney failure, unspecified: Secondary | ICD-10-CM

## 2020-01-13 LAB — BASIC METABOLIC PANEL
BUN/Creatinine Ratio: 14 (ref 10–24)
BUN: 16 mg/dL (ref 8–27)
CO2: 23 mmol/L (ref 20–29)
Calcium: 9.6 mg/dL (ref 8.6–10.2)
Chloride: 106 mmol/L (ref 96–106)
Creatinine, Ser: 1.12 mg/dL (ref 0.76–1.27)
GFR calc Af Amer: 82 mL/min/{1.73_m2} (ref >59)
GFR calc non Af Amer: 71 mL/min/{1.73_m2} (ref >59)
Glucose: 82 mg/dL (ref 65–99)
Potassium: 4.4 mmol/L (ref 3.5–5.2)
Sodium: 143 mmol/L (ref 134–144)

## 2020-01-17 NOTE — Progress Notes (Signed)
Cardiology Office Note   Date:  01/18/2020   ID:  Nathan Brewer, DOB [birth year], MRN [id number]  PCP:  Elias Else, MD  Cardiologist:   Rollene Rotunda, MD   Chief Complaint  Patient presents with   Loss of Consciousness      History of Present Illness: Nathan Brewer is a 59 y.o. male who presents for follow up of syncope.  He has a history of CVT treated with Xarelto.    Echocardiogram performed on 12/15/2018 showed EF 60 to 65%, no evidence of LV wall motion abnormality, normal RVSP, mild mitral annular calcification however no significant valve disease otherwise.  Subsequent Zio patch monitor showed no significant arrhythmia, brief run of SVT lasting only 5 beats, no further work-up was recommended.  He does have a single lead EKG strip from home on his android phone.  This showed sinus rhythm without any evidence of PVCs, VT or atrial fibrillation/atrial flutter.  His heart rate is usually borderline elevated at home in the 100 range.  He was hypotensive at the last visit and lisinopril/HCT was discontinued.  He has since had lisinopril restarted.  Blood pressure is now in the 130/80 range as it is today.  He has had no further syncope.  He denies any chest pressure, neck or arm discomfort.  He had no new shortness of breath, PND or orthopnea.  He has had no weight gain or edema.  Past Medical History:  Diagnosis Date   GERD (gastroesophageal reflux disease)    HTN (hypertension)    Pulmonary emboli (HCC)     Past Surgical History:  Procedure Laterality Date   ROTATOR CUFF REPAIR Left    x 2     Current Outpatient Medications  Medication Sig Dispense Refill   cetirizine (ZYRTEC) 10 MG tablet Take 10 mg by mouth daily.     lisinopril (ZESTRIL) 10 MG tablet Take 1 tablet (10 mg total) by mouth daily. 90 tablet 3   No current facility-administered medications for this visit.    Allergies:   Patient has no known allergies.    ROS:  Please see the  history of present illness.   Otherwise, review of systems are positive for none.   All other systems are reviewed and negative.    PHYSICAL EXAM: VS:  BP 130/88    Pulse 82    Ht 5\' 9"  (1.753 m)    Wt 213 lb 3.2 oz (96.7 kg)    SpO2 98%    BMI 31.48 kg/m  , BMI Body mass index is 31.48 kg/m. GENERAL:  Well appearing NECK:  No jugular venous distention, waveform within normal limits, carotid upstroke brisk and symmetric, no bruits, no thyromegaly LUNGS:  Clear to auscultation bilaterally CHEST:  Unremarkable HEART:  PMI not displaced or sustained,S1 and S2 within normal limits, no S3, no S4, no clicks, no rubs, no murmurs ABD:  Flat, positive bowel sounds normal in frequency in pitch, no bruits, no rebound, no guarding, no midline pulsatile mass, no hepatomegaly, no splenomegaly EXT:  2 plus pulses throughout, no edema, no cyanosis no clubbing  EKG:  EKG is not ordered today. The ekg ordered today demonstrates NA   Recent Labs: 01/04/2020: Hemoglobin 13.7; Platelets 250 01/13/2020: BUN 16; Creatinine, Ser 1.12; Potassium 4.4; Sodium 143    Lipid Panel No results found for: CHOL, TRIG, HDL, CHOLHDL, VLDL, LDLCALC, LDLDIRECT    Wt Readings from Last 3 Encounters:  01/18/20 213 lb 3.2 oz (96.7 kg)  01/04/20 203 lb (92.1 kg)  12/08/18 194 lb 12.8 oz (88.4 kg)      Other studies Reviewed: Additional studies/ records that were reviewed today include:  None. Review of the above records demonstrates:    ASSESSMENT AND PLAN:  Hypotension:   Blood pressure seems to be more normalized with the reduced medications.  He will keep an eye on this and we verified that his blood pressure cuff is accurate.  We talked about therapeutic lifestyle changes to include a little bit of weight loss and exercise.  Today I will make no changes to his medications.   Dizziness: This seems to have resolved.  Has had no further syncope.  He may have had a vagal episode.  It might have been somewhat  exacerbated by medications.  No change in therapy.   Covid education: He has received his first dose of vaccine.  We talked about the benefit of getting fully vaccinated.  Current medicines are reviewed at length with the patient today.  The patient does not have concerns regarding medicines.  The following changes have been made:  no change  Labs/ tests ordered today include: None No orders of the defined types were placed in this encounter.    Disposition:   FU with me as needed    Signed, Rollene Rotunda, MD  01/18/2020 11:56 AM    [facility]

## 2020-01-18 ENCOUNTER — Encounter: Payer: Self-pay | Admitting: Cardiology

## 2020-01-18 ENCOUNTER — Ambulatory Visit (INDEPENDENT_AMBULATORY_CARE_PROVIDER_SITE_OTHER): Payer: 59 | Admitting: Cardiology

## 2020-01-18 ENCOUNTER — Other Ambulatory Visit: Payer: Self-pay

## 2020-01-18 VITALS — BP 130/88 | HR 82 | Ht 69.0 in | Wt 213.2 lb

## 2020-01-18 DIAGNOSIS — I959 Hypotension, unspecified: Secondary | ICD-10-CM | POA: Diagnosis not present

## 2020-01-18 DIAGNOSIS — Z7189 Other specified counseling: Secondary | ICD-10-CM

## 2020-01-18 DIAGNOSIS — R42 Dizziness and giddiness: Secondary | ICD-10-CM

## 2020-01-18 NOTE — Patient Instructions (Signed)
Medication Instructions:  No changes *If you need a refill on your cardiac medications before your next appointment, please call your pharmacy*   Lab Work: Not needed   Testing/Procedures: Not needed   Follow-Up: At [facility], you and your health needs are our priority.  As part of our continuing mission to provide you with exceptional heart care, we have created designated Provider Care Teams.  These Care Teams include your primary Cardiologist (physician) and Advanced Practice Providers (APPs -  Physician Assistants and Nurse Practitioners) who all work together to provide you with the care you need, when you need it.     Your next appointment:    as needed  The format for your next appointment:   In Person  Provider:   Rollene Rotunda, MD

## 2020-09-12 IMAGING — CR CHEST - 2 VIEW
2 series · 2 of 2 positions shown · non-contrast
Comparison: Chest x-ray dated January 19, 2018.

CLINICAL DATA: Right-sided chest and flank pain for the past day
with shortness of breath. Recent rotator cuff surgery a week ago.

EXAM:
CHEST - 2 VIEW

[w chest pa]
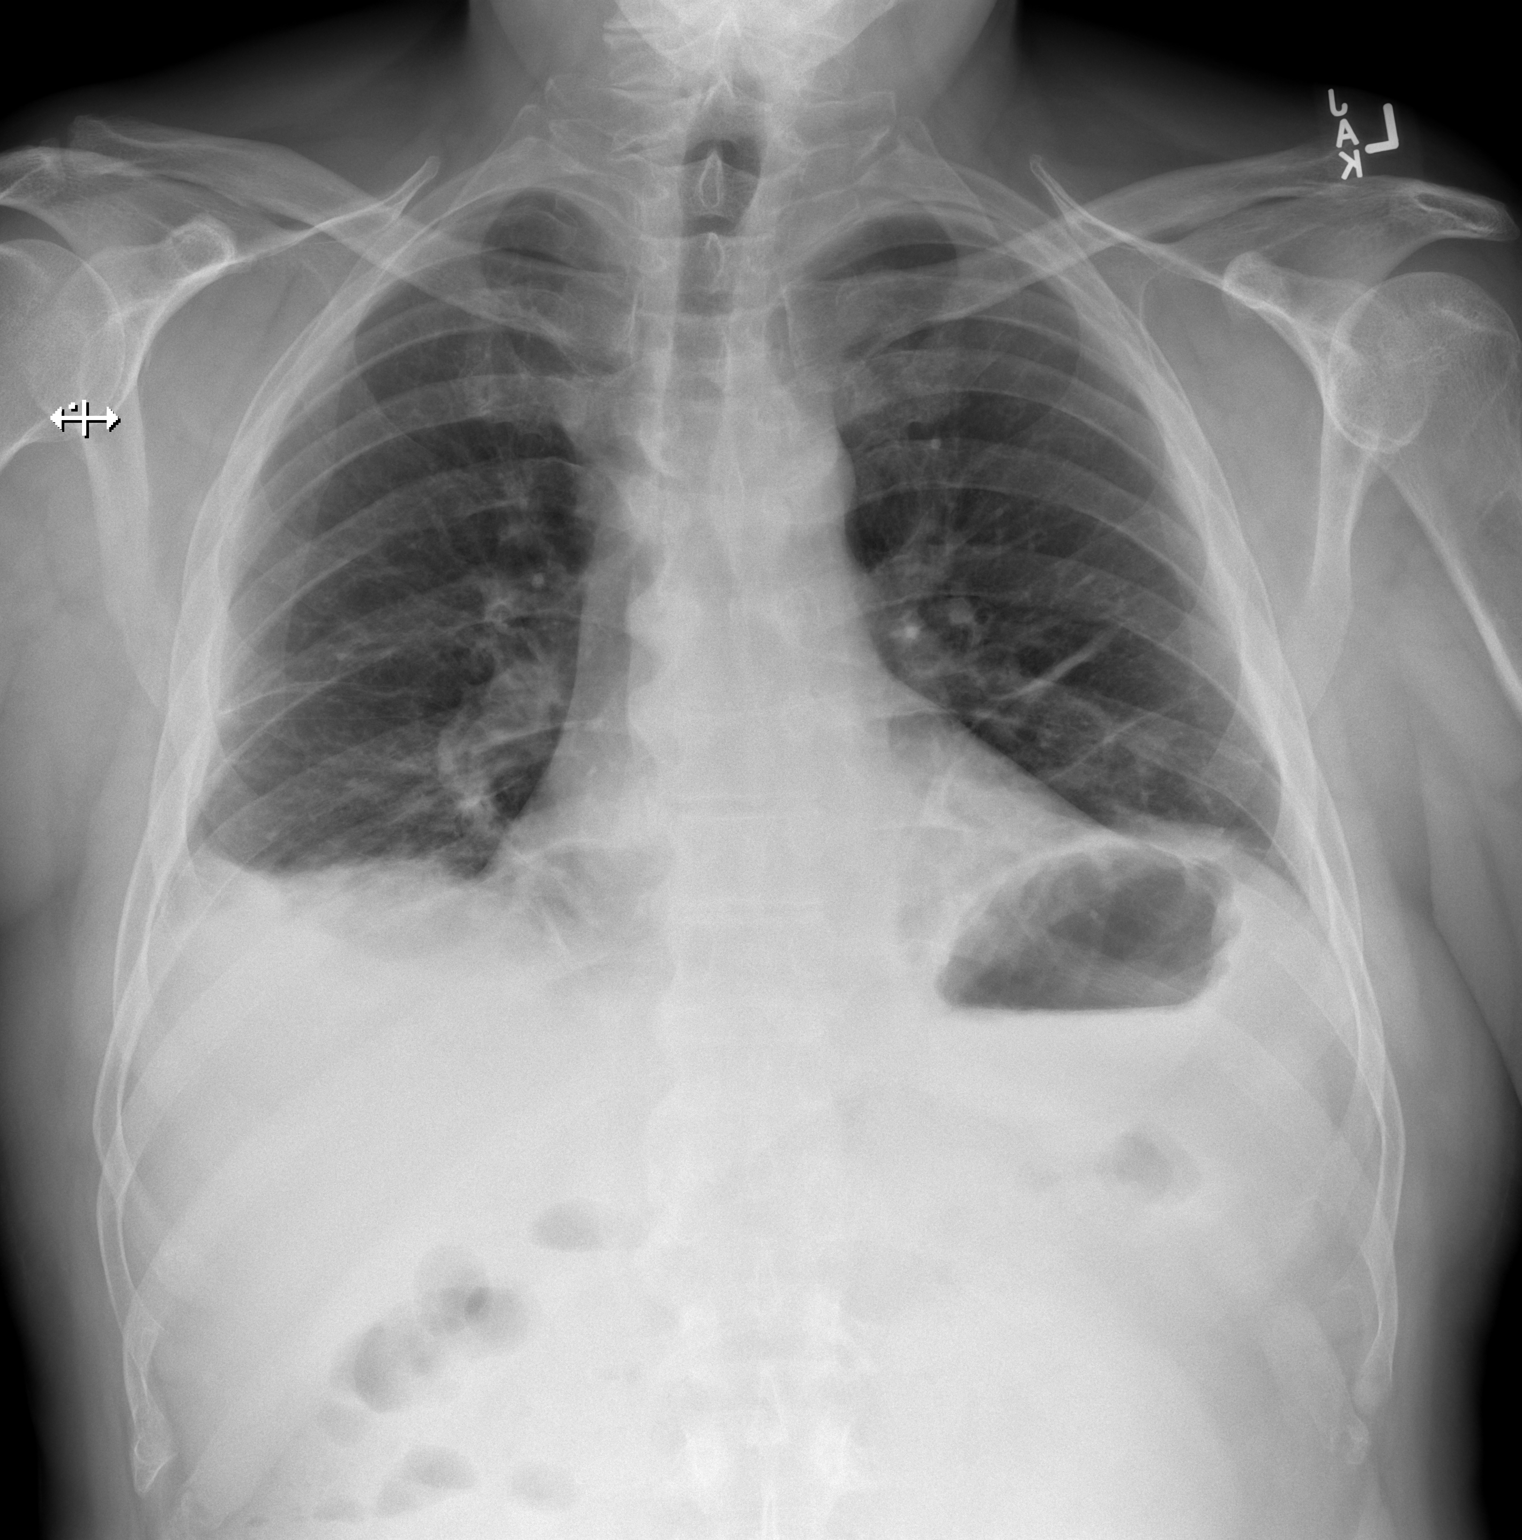

[w chest lat]
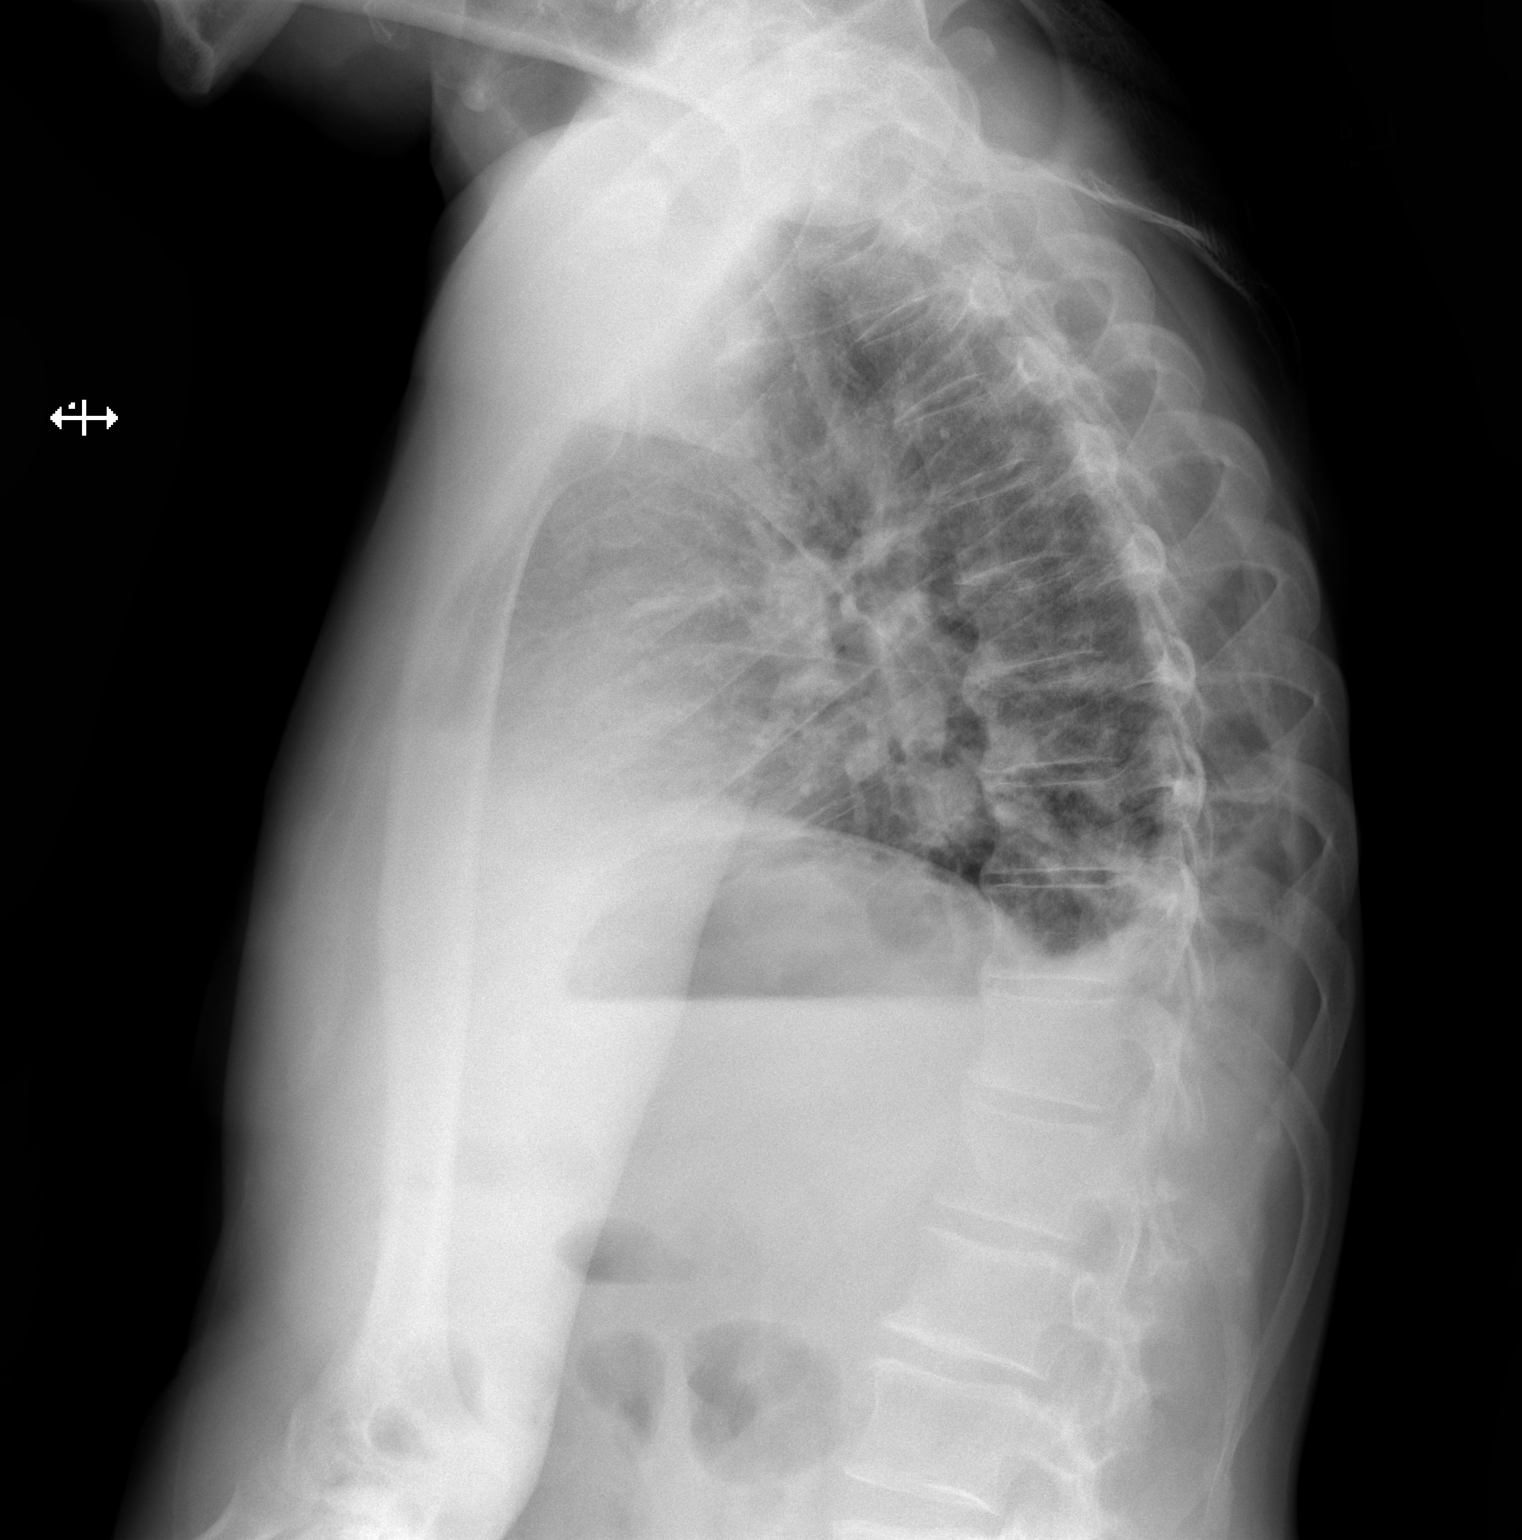

[2 of 2 positions shown; findings below may reference images not displayed]

FINDINGS: The heart size and mediastinal contours are within normal limits.
Normal pulmonary vascularity. Low lung volumes with bibasilar
atelectasis. New small right pleural effusion. No pneumothorax. No
acute osseous abnormality.
IMPRESSION: 1. New small right pleural effusion.
2. Low lung volumes with bibasilar atelectasis.

## 2021-09-24 DIAGNOSIS — H903 Sensorineural hearing loss, bilateral: Secondary | ICD-10-CM | POA: Insufficient documentation

## 2021-09-25 ENCOUNTER — Other Ambulatory Visit: Payer: Self-pay | Admitting: Physician Assistant

## 2021-09-25 DIAGNOSIS — H903 Sensorineural hearing loss, bilateral: Secondary | ICD-10-CM

## 2021-10-26 ENCOUNTER — Ambulatory Visit
Admission: RE | Admit: 2021-10-26 | Discharge: 2021-10-26 | Disposition: A | Discharge status: Home / Self Care | Payer: 59 | Source: Ambulatory Visit | Attending: Physician Assistant | Admitting: Physician Assistant

## 2021-10-26 DIAGNOSIS — H903 Sensorineural hearing loss, bilateral: Secondary | ICD-10-CM

## 2021-10-26 MED ORDER — GADOBENATE DIMEGLUMINE 529 MG/ML IV SOLN
19.0000 mL | Freq: Once | INTRAVENOUS | Status: AC | PRN
  Administered 2021-10-26: 19 mL via INTRAVENOUS

## 2022-11-07 DIAGNOSIS — M546 Pain in thoracic spine: Secondary | ICD-10-CM | POA: Diagnosis not present

## 2022-11-07 DIAGNOSIS — M542 Cervicalgia: Secondary | ICD-10-CM | POA: Diagnosis not present

## 2022-11-13 DIAGNOSIS — M546 Pain in thoracic spine: Secondary | ICD-10-CM | POA: Diagnosis not present

## 2022-11-13 DIAGNOSIS — M542 Cervicalgia: Secondary | ICD-10-CM | POA: Diagnosis not present

## 2022-11-18 DIAGNOSIS — M542 Cervicalgia: Secondary | ICD-10-CM | POA: Diagnosis not present

## 2022-11-18 DIAGNOSIS — M546 Pain in thoracic spine: Secondary | ICD-10-CM | POA: Diagnosis not present

## 2022-11-19 DIAGNOSIS — H40033 Anatomical narrow angle, bilateral: Secondary | ICD-10-CM | POA: Diagnosis not present

## 2022-11-19 DIAGNOSIS — H1045 Other chronic allergic conjunctivitis: Secondary | ICD-10-CM | POA: Diagnosis not present

## 2022-11-19 DIAGNOSIS — H2513 Age-related nuclear cataract, bilateral: Secondary | ICD-10-CM | POA: Diagnosis not present

## 2022-11-19 DIAGNOSIS — G43B0 Ophthalmoplegic migraine, not intractable: Secondary | ICD-10-CM | POA: Diagnosis not present

## 2022-11-27 DIAGNOSIS — M542 Cervicalgia: Secondary | ICD-10-CM | POA: Diagnosis not present

## 2022-11-27 DIAGNOSIS — M546 Pain in thoracic spine: Secondary | ICD-10-CM | POA: Diagnosis not present

## 2023-02-12 DIAGNOSIS — K5730 Diverticulosis of large intestine without perforation or abscess without bleeding: Secondary | ICD-10-CM | POA: Diagnosis not present

## 2023-02-12 DIAGNOSIS — K644 Residual hemorrhoidal skin tags: Secondary | ICD-10-CM | POA: Diagnosis not present

## 2023-02-12 DIAGNOSIS — Z1211 Encounter for screening for malignant neoplasm of colon: Secondary | ICD-10-CM | POA: Diagnosis not present

## 2023-04-14 DIAGNOSIS — M25511 Pain in right shoulder: Secondary | ICD-10-CM | POA: Diagnosis not present

## 2023-08-13 DIAGNOSIS — Z125 Encounter for screening for malignant neoplasm of prostate: Secondary | ICD-10-CM | POA: Diagnosis not present

## 2023-08-13 DIAGNOSIS — Z13220 Encounter for screening for lipoid disorders: Secondary | ICD-10-CM | POA: Diagnosis not present

## 2023-08-13 DIAGNOSIS — M109 Gout, unspecified: Secondary | ICD-10-CM | POA: Diagnosis not present

## 2023-08-13 DIAGNOSIS — Z0000 Encounter for general adult medical examination without abnormal findings: Secondary | ICD-10-CM | POA: Diagnosis not present

## 2023-08-28 DIAGNOSIS — M25511 Pain in right shoulder: Secondary | ICD-10-CM | POA: Diagnosis not present

## 2023-09-08 DIAGNOSIS — M25511 Pain in right shoulder: Secondary | ICD-10-CM | POA: Diagnosis not present

## 2023-09-15 DIAGNOSIS — R945 Abnormal results of liver function studies: Secondary | ICD-10-CM | POA: Diagnosis not present

## 2023-09-16 ENCOUNTER — Other Ambulatory Visit: Payer: Self-pay | Admitting: Family Medicine

## 2023-09-16 DIAGNOSIS — R7309 Other abnormal glucose: Secondary | ICD-10-CM | POA: Diagnosis not present

## 2023-09-16 DIAGNOSIS — R7989 Other specified abnormal findings of blood chemistry: Secondary | ICD-10-CM

## 2023-09-19 ENCOUNTER — Ambulatory Visit
Admission: RE | Admit: 2023-09-19 | Discharge: 2023-09-19 | Disposition: A | Discharge status: Home / Self Care | Source: Ambulatory Visit | Attending: Family Medicine | Admitting: Family Medicine

## 2023-09-19 DIAGNOSIS — R945 Abnormal results of liver function studies: Secondary | ICD-10-CM | POA: Diagnosis not present

## 2023-09-19 DIAGNOSIS — R7989 Other specified abnormal findings of blood chemistry: Secondary | ICD-10-CM

## 2023-09-22 DIAGNOSIS — M25511 Pain in right shoulder: Secondary | ICD-10-CM | POA: Diagnosis not present

## 2023-11-25 DIAGNOSIS — H2513 Age-related nuclear cataract, bilateral: Secondary | ICD-10-CM | POA: Diagnosis not present

## 2023-11-25 DIAGNOSIS — H40033 Anatomical narrow angle, bilateral: Secondary | ICD-10-CM | POA: Diagnosis not present

## 2023-11-25 DIAGNOSIS — H1045 Other chronic allergic conjunctivitis: Secondary | ICD-10-CM | POA: Diagnosis not present

## 2023-11-25 DIAGNOSIS — G43B0 Ophthalmoplegic migraine, not intractable: Secondary | ICD-10-CM | POA: Diagnosis not present

## 2024-04-01 DIAGNOSIS — R42 Dizziness and giddiness: Secondary | ICD-10-CM

## 2024-04-01 DIAGNOSIS — I959 Hypotension, unspecified: Secondary | ICD-10-CM

## 2024-04-01 HISTORY — DX: Hypotension, unspecified: I95.9

## 2024-04-01 HISTORY — DX: Dizziness and giddiness: R42

## 2024-04-01 NOTE — Progress Notes (Unsigned)
 Cardiology Office Note   Date:  04/02/2024   ID:  Nathan Brewer, DOB 1961/05/16, MRN [id number]  PCP:  Gib Charleston, MD  Cardiologist:   Lynwood Schilling, MD Referring:  Gib Charleston, MD  No chief complaint on file.     History of Present Illness: Nathan Brewer is a 63 y.o. male who presents for who presents for follow up of syncope.  He has a history of DVT treated with Xarelto .    Echocardiogram performed on 12/15/2018 showed EF 60 to 65%, no evidence of LV wall motion abnormality, normal RVSP, mild mitral annular calcification however no significant valve disease otherwise.  Subsequent Zio patch monitor showed no significant arrhythmia, brief run of SVT lasting only 5 beats, no further work-up was recommended.    He returns for follow-up and said he has had a couple of episodes of dizziness.  At the same time he noticed his blood pressure was starting to creep up.  He had been off blood pressure medications because he had been hypotensive in the past with lisinopril .  However, when he restarted it seems like he might of had a 20 mg dose of lisinopril .  He is now down to a 5 mg dose and his blood pressure that he is tracking at home has been well-controlled.  He is worried that his heart rate is a little bit high in the 80s to 90s at rest.  It does seem to come down a little bit with sleep.  He is not really feeling this.  He is not having any palpitations, presyncope or syncope.  He denies any chest pressure, neck or arm discomfort.  He has had no sustained arrhythmias or SVT like he had previously.  He is not exercising as much as I do like but he is about to get his bicycle out and start exercising.  He is meeting now with a nutritionist.  He does walk his dog without limitations.  He has had some shoulder pain but it in his right shoulder.  Seems to have resolved but he was noticing this a few weeks ago.  It seemed to be somewhat with movement.  There was not substernal discomfort  or associated symptoms.   Past Medical History:  Diagnosis Date   GERD (gastroesophageal reflux disease)    HTN (hypertension)    Pulmonary emboli (HCC)     Past Surgical History:  Procedure Laterality Date   ROTATOR CUFF REPAIR Left    x 2     Current Outpatient Medications  Medication Sig Dispense Refill   allopurinol (ZYLOPRIM) 100 MG tablet Take 100 mg by mouth daily.     cetirizine (ZYRTEC) 10 MG tablet Take 10 mg by mouth daily.     lisinopril  (ZESTRIL ) 5 MG tablet Take 5 mg by mouth daily.     Multiple Vitamin (MULTI VITAMIN) TABS Take 1 tablet by mouth daily at 6 (six) AM.     No current facility-administered medications for this visit.    Allergies:   Patient has no known allergies.    Social History:  The patient  reports that he has never smoked. He has never used smokeless tobacco. He reports current alcohol use. He reports that he does not use drugs.   Family History:  The patient's family history includes CAD in his mother.   ROS:  Please see the history of present illness.   Otherwise, review of systems are positive for none.   All other systems are  reviewed and negative.    PHYSICAL EXAM: VS:  BP 133/88   Pulse 87   Ht 5' 9 (1.753 m)   Wt 219 lb 14.4 oz (99.7 kg)   SpO2 93%   BMI 32.47 kg/m  , BMI Body mass index is 32.47 kg/m. GENERAL:  Well appearing HEENT:  Pupils equal round and reactive, fundi not visualized, oral mucosa unremarkable NECK:  No jugular venous distention, waveform within normal limits, carotid upstroke brisk and symmetric, no bruits, no thyromegaly LYMPHATICS:  No cervical, inguinal adenopathy LUNGS:  Clear to auscultation bilaterally BACK:  No CVA tenderness CHEST:  Unremarkable HEART:  PMI not displaced or sustained,S1 and S2 within normal limits, no S3, no S4, no clicks, no rubs, no murmurs ABD:  Flat, positive bowel sounds normal in frequency in pitch, no bruits, no rebound, no guarding, no midline pulsatile mass, no  hepatomegaly, no splenomegaly EXT:  2 plus pulses throughout, no edema, no cyanosis no clubbing SKIN:  No rashes no nodules NEURO:  Cranial nerves II through XII grossly intact, motor grossly intact throughout [facility]:  Cognitively intact, oriented to person place and time    EKG:  EKG Interpretation Date/Time:  Friday April 02 2024 10:43:16 EST Ventricular Rate:  87 PR Interval:  164 QRS Duration:  90 QT Interval:  380 QTC Calculation: 457 R Axis:   -11  Text Interpretation: Normal sinus rhythm When compared with ECG of 22-Oct-2018 14:07, No significant change since last tracing Confirmed by Lavona Agent ([id number]) on 04/02/2024 10:48:36 AM     Recent Labs: No results found for requested labs within last 365 days.    Lipid Panel No results found for: CHOL, TRIG, HDL, CHOLHDL, VLDL, LDLCALC, LDLDIRECT    Wt Readings from Last 3 Encounters:  04/02/24 219 lb 14.4 oz (99.7 kg)  01/18/20 213 lb 3.2 oz (96.7 kg)  01/04/20 203 lb (92.1 kg)      Other studies Reviewed: Additional studies/ records that were reviewed today include: Labs Review of the above records demonstrates:  Please see elsewhere in the note.     ASSESSMENT AND PLAN:  Hypertension:   His blood pressure seems to be well-controlled and he will continue the meds as listed.  Shoulder pain: He does have coronary calcium and risk factors.  I like to bring him back for a POET (Plain Old Exercise Treadmill)  Dyslipidemia: LDL was mildly elevated to 106 but his HDL is 69.  He is gena get that repeated now that he has been dieting and my suggestion would be goal LDL in the 70s.  He can discuss this with his primary provider if he is not at target.  Current medicines are reviewed at length with the patient today.  The patient does not have concerns regarding medicines.  The following changes have been made: None  Labs/ tests ordered today include:   Orders Placed This Encounter  Procedures   EKG  12-Lead     Disposition:   FU with me as needed   Signed, Agent Lavona, MD  04/02/2024 10:49 AM    [facility]

## 2024-04-02 ENCOUNTER — Ambulatory Visit: Attending: Cardiology | Admitting: Cardiology

## 2024-04-02 VITALS — BP 133/88 | HR 87 | Ht 69.0 in | Wt 219.9 lb

## 2024-04-02 DIAGNOSIS — I959 Hypotension, unspecified: Secondary | ICD-10-CM | POA: Diagnosis present

## 2024-04-02 DIAGNOSIS — M79603 Pain in arm, unspecified: Secondary | ICD-10-CM | POA: Insufficient documentation

## 2024-04-02 DIAGNOSIS — R42 Dizziness and giddiness: Secondary | ICD-10-CM | POA: Insufficient documentation

## 2024-04-02 NOTE — Patient Instructions (Signed)
 Medication Instructions:  Your physician recommends that you continue on your current medications as directed. Please refer to the Current Medication list given to you today.  *If you need a refill on your cardiac medications before your next appointment, please call your pharmacy*  Lab Work: NONE If you have labs (blood work) drawn today and your tests are completely normal, you will receive your results only by: MyChart Message (if you have MyChart) OR A paper copy in the mail If you have any lab test that is abnormal or we need to change your treatment, we will call you to review the results.  Testing/Procedures: Exercise Tolerance Test **NO food or drink for 4 hours prior to test **NO caffeine, decaf or chocolate for 12 hours prior to test **Wear comfortable clothes and close toed shoes  Follow-Up: At [facility], you and your health needs are our priority.  As part of our continuing mission to provide you with exceptional heart care, our providers are all part of one team.  This team includes your primary Cardiologist (physician) and Advanced Practice Providers or APPs (Physician Assistants and Nurse Practitioners) who all work together to provide you with the care you need, when you need it.  Your next appointment:   As needed  Provider:   Lavona, MD  We recommend signing up for the patient portal called MyChart.  Sign up information is provided on this After Visit Summary.  MyChart is used to connect with patients for Virtual Visits (Telemedicine).  Patients are able to view lab/test results, encounter notes, upcoming appointments, etc.  Non-urgent messages can be sent to your provider as well.   To learn more about what you can do with MyChart, go to [email or web address].

## 2024-04-16 ENCOUNTER — Encounter (HOSPITAL_COMMUNITY)

## 2024-04-16 ENCOUNTER — Ambulatory Visit: Admitting: Cardiology

## 2024-04-21 ENCOUNTER — Encounter: Payer: Self-pay | Admitting: Cardiology

## 2024-04-21 DIAGNOSIS — R55 Syncope and collapse: Secondary | ICD-10-CM

## 2024-04-21 DIAGNOSIS — I959 Hypotension, unspecified: Secondary | ICD-10-CM

## 2024-04-21 DIAGNOSIS — R42 Dizziness and giddiness: Secondary | ICD-10-CM

## 2024-04-27 NOTE — Telephone Encounter (Signed)
 Spoke with pt. Echo (per Dr. Lavona) and 4 week monitor ordered. Pt aware. Pt scheduled to see Hochrein 12/5. Pt given ED precautions. Pt verbalized understanding. All questions if any were answered.

## 2024-04-29 ENCOUNTER — Telehealth (HOSPITAL_COMMUNITY): Payer: Self-pay | Admitting: *Deleted

## 2024-04-29 DIAGNOSIS — R55 Syncope and collapse: Secondary | ICD-10-CM | POA: Insufficient documentation

## 2024-04-29 DIAGNOSIS — M79603 Pain in arm, unspecified: Secondary | ICD-10-CM

## 2024-04-29 DIAGNOSIS — E785 Hyperlipidemia, unspecified: Secondary | ICD-10-CM | POA: Insufficient documentation

## 2024-04-29 DIAGNOSIS — I10 Essential (primary) hypertension: Secondary | ICD-10-CM | POA: Insufficient documentation

## 2024-04-29 HISTORY — DX: Hyperlipidemia, unspecified: E78.5

## 2024-04-29 HISTORY — DX: Pain in arm, unspecified: M79.603

## 2024-04-29 HISTORY — DX: Syncope and collapse: R55

## 2024-04-29 NOTE — Telephone Encounter (Signed)
 Instructions given for upcoming GXT on 05/07/24 at 8:30.

## 2024-04-29 NOTE — Progress Notes (Unsigned)
 Cardiology Office Note:   Date:  04/30/2024  ID:  Nathan Brewer, DOB 10/21/1960, MRN [id number] PCP: Gib Charleston, MD  [facility] Providers Cardiologist:  Lynwood Schilling, MD {  History of Present Illness:   Nathan Brewer is a 63 y.o. male who presents for who presents for follow up of syncope.  He has a history of DVT treated with Xarelto .    Echocardiogram performed on 12/15/2018 showed EF 60 to 65%, no evidence of LV wall motion abnormality, normal RVSP, mild mitral annular calcification however no significant valve disease otherwise.  Subsequent Zio patch monitor showed no significant arrhythmia, brief run of SVT lasting only 5 beats, no further work-up was recommended.     I saw him recently for evaluation of dizziness. He was to have a POET.  However, prior to getting this done he had syncope in his home.  He actually sent me a video of this.   He does occasionally get dizzy standing up quickly.  He says that they he did not really feel that.  He went to the kitchen and took a couple of sips of water and does not think he really felt anything before he went out.  He bumped his shoulder a little bit.  His wife said he was out for several seconds and he thinks he was out for only a few.  He did not have loss of bowel or bladder or seizure activity.  This is the first time he is done this in about 5 years.  He was having some dizziness as described in the recent note.  He been having a little bit of shortness of breath and I was planning to do a POET (Plain Old Exercise Treadmill).    ROS: As stated in the HPI and negative for all other systems.  Studies Reviewed:    EKG:   EKG Interpretation Date/Time:  Friday April 30 2024 14:23:29 EST Ventricular Rate:  105 PR Interval:  164 QRS Duration:  92 QT Interval:  346 QTC Calculation: 457 R Axis:   31  Text Interpretation: Sinus tachycardia Incomplete right bundle branch block When compared with ECG of 02-Apr-2024 10:43, No  significant change was found Confirmed by Schilling Lynwood ([id number]) on 04/30/2024 2:50:33 PM   Risk Assessment/Calculations:    Physical Exam:   VS:  Ht 5' 9 (1.753 m)   Wt 214 lb (97.1 kg)   SpO2 95%   BMI 31.60 kg/m    Wt Readings from Last 3 Encounters:  04/30/24 214 lb (97.1 kg)  04/02/24 219 lb 14.4 oz (99.7 kg)  01/18/20 213 lb 3.2 oz (96.7 kg)     GEN: Well nourished, well developed in no acute distress NECK: No JVD; No carotid bruits CARDIAC: RRR, no murmurs, rubs, gallops RESPIRATORY:  Clear to auscultation without rales, wheezing or rhonchi  ABDOMEN: Soft, non-tender, non-distended EXTREMITIES:  No edema; No deformity   ASSESSMENT AND PLAN:   Hypertension:   For now many continue the current blood pressure medications.  We talked about orthostatic precautions as below.  I plan on doing the workup as below first but having a low threshold for perfusion imaging.   I am going to cancel the previously planned shoulder pain: Stress test.   Dyslipidemia: LDL was 106 with an HDL of 69.  The plan is to repeat this after he has been dieting which he promises to do.  M  Syncope: I am going to start with a 4-week monitor.  Echocardiogram is been ordered.  He has been given instructions not to drive.  I will have a low threshold for loop implant having witnessed on camera of this event.  It may well have been orthostatic but I need to exclude arrhythmia.  Today in the office he was not orthostatic  Follow up with me after the monitor and echo.  Signed, Lynwood Schilling, MD

## 2024-04-30 ENCOUNTER — Encounter: Payer: Self-pay | Admitting: Cardiology

## 2024-04-30 ENCOUNTER — Ambulatory Visit: Attending: Cardiology | Admitting: Cardiology

## 2024-04-30 VITALS — Ht 69.0 in | Wt 214.0 lb

## 2024-04-30 DIAGNOSIS — R55 Syncope and collapse: Secondary | ICD-10-CM

## 2024-04-30 DIAGNOSIS — E785 Hyperlipidemia, unspecified: Secondary | ICD-10-CM | POA: Diagnosis present

## 2024-04-30 DIAGNOSIS — I10 Essential (primary) hypertension: Secondary | ICD-10-CM | POA: Diagnosis present

## 2024-04-30 DIAGNOSIS — M79603 Pain in arm, unspecified: Secondary | ICD-10-CM | POA: Diagnosis present

## 2024-04-30 NOTE — Patient Instructions (Signed)
 Medication Instructions:  Your physician recommends that you continue on your current medications as directed. Please refer to the Current Medication list given to you today.  *If you need a refill on your cardiac medications before your next appointment, please call your pharmacy*  Lab Work: NONE If you have labs (blood work) drawn today and your tests are completely normal, you will receive your results only by: MyChart Message (if you have MyChart) OR A paper copy in the mail If you have any lab test that is abnormal or we need to change your treatment, we will call you to review the results.  Testing/Procedures: NONE  Follow-Up: At [facility], you and your health needs are our priority.  As part of our continuing mission to provide you with exceptional heart care, our providers are all part of one team.  This team includes your primary Cardiologist (physician) and Advanced Practice Providers or APPs (Physician Assistants and Nurse Practitioners) who all work together to provide you with the care you need, when you need it.  Your next appointment:   January 2026  Provider:   Lavona, MD  We recommend signing up for the patient portal called MyChart.  Sign up information is provided on this After Visit Summary.  MyChart is used to connect with patients for Virtual Visits (Telemedicine).  Patients are able to view lab/test results, encounter notes, upcoming appointments, etc.  Non-urgent messages can be sent to your provider as well.   To learn more about what you can do with MyChart, go to [email or web address].

## 2024-05-07 ENCOUNTER — Ambulatory Visit (HOSPITAL_COMMUNITY): Admission: RE | Admit: 2024-05-07 | Discharge: 2024-05-07 | Attending: Cardiology | Admitting: Cardiology

## 2024-05-07 ENCOUNTER — Ambulatory Visit (HOSPITAL_COMMUNITY)

## 2024-05-07 DIAGNOSIS — R55 Syncope and collapse: Secondary | ICD-10-CM

## 2024-05-07 LAB — ECHOCARDIOGRAM COMPLETE
AR max vel: 3.33 cm2
AV Area VTI: 3.7 cm2
AV Area mean vel: 3.1 cm2
AV Mean grad: 3 mmHg
AV Peak grad: 4.3 mmHg
Ao pk vel: 1.04 m/s
Area-P 1/2: 6.32 cm2
Calc EF: 63 %
S' Lateral: 2.7 cm
Single Plane A2C EF: 66.8 %
Single Plane A4C EF: 58.1 %

## 2024-05-09 ENCOUNTER — Ambulatory Visit: Payer: Self-pay | Admitting: Cardiology

## 2024-06-01 ENCOUNTER — Ambulatory Visit: Attending: Cardiology

## 2024-06-01 DIAGNOSIS — R55 Syncope and collapse: Secondary | ICD-10-CM

## 2024-06-02 DIAGNOSIS — R55 Syncope and collapse: Secondary | ICD-10-CM | POA: Diagnosis not present

## 2024-06-07 DIAGNOSIS — I2699 Other pulmonary embolism without acute cor pulmonale: Secondary | ICD-10-CM | POA: Insufficient documentation

## 2024-06-07 DIAGNOSIS — K219 Gastro-esophageal reflux disease without esophagitis: Secondary | ICD-10-CM | POA: Insufficient documentation

## 2024-06-15 NOTE — Progress Notes (Unsigned)
" °  Cardiology Office Note:   Date:  06/17/2024  ID:  Nathan Brewer, DOB [birth year], MRN [id number] PCP: Gib Charleston, MD  [facility] Providers Cardiologist:  Lynwood Schilling, MD {  History of Present Illness:   Nathan Brewer is a 64 y.o. male who presents for who presents for follow up of syncope.  He has a history of DVT treated with Xarelto .    Echocardiogram performed on 12/15/2018 showed EF 60 to 65%, no evidence of LV wall motion abnormality, normal RVSP, mild mitral annular calcification however no significant valve disease otherwise.  Subsequent Zio patch monitor showed no significant arrhythmia, brief run of SVT lasting only 5 beats, no further work-up was recommended.     I saw him recently for evaluation of syncope.  Monitor at home demonstrated no arrhythmias.  Echo was unremarkable.  Since his last event he has had no further episodes.  He is doing his usual activities.  He denies any cardiovascular symptoms.  He has had no further syncope, presyncope.  He might have some mild orthostasis.  He is not having any palpitations.  He had no chest pressure, neck or arm discomfort.  He has had some mild shortness of breath with activities as described previously.  ROS: As stated in the HPI and negative for all other systems.  Studies Reviewed:    EKG:     NA  Risk Assessment/Calculations:              Physical Exam:   VS:  BP 124/70   Pulse 100   Ht 5' 11 (1.803 m)   Wt 216 lb (98 kg)   SpO2 92%   BMI 30.13 kg/m    Wt Readings from Last 3 Encounters:  06/17/24 216 lb (98 kg)  04/30/24 214 lb (97.1 kg)  04/02/24 219 lb 14.4 oz (99.7 kg)     GEN: Well nourished, well developed in no acute distress NECK: No JVD; No carotid bruits CARDIAC: RRR, no murmurs, rubs, gallops RESPIRATORY:  Clear to auscultation without rales, wheezing or rhonchi  ABDOMEN: Soft, non-tender, non-distended EXTREMITIES:  No edema; No deformity   ASSESSMENT AND PLAN:   Hypertension:    Patient's blood pressure is currently well-controlled.  No change in therapy.  Dyslipidemia: LDL was mildly elevated at 106.  The plan was diet control followed by 74-month repeat lipid.    Syncope: Workup thus far has been unremarkable.  I suspect this was orthostasis but he still can be driving till he completes 6 months without syncope.  If he has another event and then have him have an event monitor.   Shortness of breath: My pretest probability of obstructive coronary disease is low but I would like to screen him with a POET (Plain Old Exercise Treadmill).  I want to see if we can reproduce any symptoms or arrhythmias with this as well as do a low-level screening for obstructive coronary disease.    Follow up with me in six 12 months.  Signed, Lynwood Schilling, MD   "

## 2024-06-17 ENCOUNTER — Ambulatory Visit: Attending: Cardiology | Admitting: Cardiology

## 2024-06-17 ENCOUNTER — Encounter: Payer: Self-pay | Admitting: Cardiology

## 2024-06-17 VITALS — BP 124/70 | HR 100 | Ht 71.0 in | Wt 216.0 lb

## 2024-06-17 DIAGNOSIS — R0602 Shortness of breath: Secondary | ICD-10-CM | POA: Diagnosis present

## 2024-06-17 DIAGNOSIS — I10 Essential (primary) hypertension: Secondary | ICD-10-CM | POA: Insufficient documentation

## 2024-06-17 DIAGNOSIS — E785 Hyperlipidemia, unspecified: Secondary | ICD-10-CM | POA: Insufficient documentation

## 2024-06-17 DIAGNOSIS — R55 Syncope and collapse: Secondary | ICD-10-CM | POA: Insufficient documentation

## 2024-06-17 NOTE — Patient Instructions (Signed)
 Medication Instructions:  Your physician recommends that you continue on your current medications as directed. Please refer to the Current Medication list given to you today.  *If you need a refill on your cardiac medications before your next appointment, please call your pharmacy*  Lab Work: NONE If you have labs (blood work) drawn today and your tests are completely normal, you will receive your results only by: MyChart Message (if you have MyChart) OR A paper copy in the mail If you have any lab test that is abnormal or we need to change your treatment, we will call you to review the results.  Testing/Procedures: Exercise Tolerance Test *No food or drink for 4 hours prior to your test *No caffeine, decaf or chocolate for 12 hours prior to test *Wear comfortable clothing and close toed shoes  Follow-Up: At [facility], you and your health needs are our priority.  As part of our continuing mission to provide you with exceptional heart care, our providers are all part of one team.  This team includes your primary Cardiologist (physician) and Advanced Practice Providers or APPs (Physician Assistants and Nurse Practitioners) who all work together to provide you with the care you need, when you need it.  Your next appointment:   1 year(s)  Provider:   Lynwood Schilling, MD    We recommend signing up for the patient portal called MyChart.  Sign up information is provided on this After Visit Summary.  MyChart is used to connect with patients for Virtual Visits (Telemedicine).  Patients are able to view lab/test results, encounter notes, upcoming appointments, etc.  Non-urgent messages can be sent to your provider as well.   To learn more about what you can do with MyChart, go to [email or web address].

## 2024-07-23 ENCOUNTER — Encounter (HOSPITAL_COMMUNITY)
